# Patient Record
Sex: Female | Born: 1965
Health system: Southern US, Community
[De-identification: ages and names within clinical notes are randomized; demographics above are authoritative.]

## PROBLEM LIST (undated history)

## (undated) DIAGNOSIS — J45909 Unspecified asthma, uncomplicated: Secondary | ICD-10-CM

## (undated) DIAGNOSIS — E039 Hypothyroidism, unspecified: Secondary | ICD-10-CM

## (undated) DIAGNOSIS — G4733 Obstructive sleep apnea (adult) (pediatric): Principal | ICD-10-CM

## (undated) HISTORY — PX: REDUCTION MAMMAPLASTY: SUR839

## (undated) HISTORY — DX: Obstructive sleep apnea (adult) (pediatric): G47.33

## (undated) HISTORY — PX: ABDOMINAL HERNIA REPAIR: SHX539

## (undated) HISTORY — PX: ADENOIDECTOMY: SUR15

## (undated) HISTORY — PX: FINGER SURGERY: SHX640

## (undated) HISTORY — DX: Unspecified asthma, uncomplicated: J45.909

## (undated) HISTORY — DX: Hypothyroidism, unspecified: E03.9

## (undated) HISTORY — PX: TONSILLECTOMY: SUR1361

---

## 1994-02-24 HISTORY — PX: TUBAL LIGATION: SHX77

## 2004-02-25 HISTORY — PX: CERVICAL ABLATION: SHX5771

## 2005-11-07 ENCOUNTER — Ambulatory Visit (HOSPITAL_COMMUNITY): Admission: RE | Admit: 2005-11-07 | Discharge: 2005-11-07 | Payer: Self-pay | Admitting: Gynecology

## 2005-11-07 ENCOUNTER — Encounter (INDEPENDENT_AMBULATORY_CARE_PROVIDER_SITE_OTHER): Payer: Self-pay | Admitting: Specialist

## 2006-10-16 ENCOUNTER — Other Ambulatory Visit: Admission: RE | Admit: 2006-10-16 | Discharge: 2006-10-16 | Payer: Self-pay | Admitting: Gynecology

## 2010-07-12 NOTE — H&P (Signed)
NAME:  Andrea Terrell, Andrea Terrell             ACCOUNT NO.:  [id number]   MEDICAL RECORD NO.:  [id number]          PATIENT TYPE:  AMB   LOCATION:  SDC                           FACILITY:  [facility]   PHYSICIAN:  Timothy P. Fontaine, M.D.DATE OF BIRTH:  1965/07/31   DATE OF ADMISSION:  DATE OF DISCHARGE:                                HISTORY & PHYSICAL   She is being admitted to [facility] August 3, 7:30 for surgery.   CHIEF COMPLAINT:  Increasing menorrhagia and dysmenorrhea.   HISTORY OF PRESENT ILLNESS:  A 45 year old G31, P2, AB1 female status post  tubal sterilization who complains of increasing menorrhagia and  dysmenorrhea.  The patient was evaluated and treated for presumptive  endometriosis with 2 months' of Lupron which suppressed her menses and her  dysmenorrhea resolved.  Upon resuming her menses, pain returned which is now  becoming disabling.  During her menses, interfering with her life, noting  that she is bleeding 6-7 days each month.  She underwent outpatient  sonohistogram which showed a normal empty cavity and a thyroid TSH which was  normal.  Her hemoglobin was 14.5.  She is admitted at this time for laser  laparoscopy, NovaSure endometrial ablation hysteroscopy.   PAST MEDICAL HISTORY:  1.  Includes asthma.  2.  Anxiety.   PAST SURGICAL HISTORY:  1.  Includes umbilical hernia repair in 1971.  2.  Tonsil adenoidectomy.  3.  Tubal ligation.  4.  Breast reductive surgery, 1993.   CURRENT MEDICATIONS:  1.  Include Singulair 10 mg daily.  2.  Advair 550.  3.  Allegra 180 daily.  4.  Albuterol p.r.n.  5.  Effexor 75 mg daily.   CURRENT MEDICATION ALLERGIES:  INCLUDE PENICILLIN, SULFA.   REVIEW OF SYSTEMS:  Noncontributory.   SOCIAL HISTORY:  Noncontributory.   FAMILY HISTORY:  Noncontributory.   ADMISSION PHYSICAL EXAM:  Afebrile.  Vital signs stable.  HEENT: Normal.  LUNGS:  Clear.  CARDIAC:  Regular rate.  No rubs, murmurs or gallops.  ABDOMEN:  Exam  benign.  PELVIC:  External BUS, vagina normal.  Cervix normal.  Uterus normal size,  shape, contour, midline and mobile.  Adnexa without masses or tenderness.   ASSESSMENT:  A 45 year old G68, P2 female status post laparoscopic tubal  sterilization, increasing dysmenorrhea, menorrhagia, presumptively treated  for endometriosis per physician in [place] with Depo and Lupron x2 months,  resolution of her symptoms with now resumption of her symptoms.  The patient  is admitted at this time for laser laparoscopy, endometrial ablation and  hysteroscopy.  I reviewed with the patient the proposed surgeries, expected  intraoperative and postoperative courses, acute intraoperative and  postoperative risks and the long-term issues associated with her surgeries.  She understands there are no guarantees as far as her pain relief is  concerned.  Her pain may persist, worsen or change following the procedure  as well as her menorrhagia.  She understands that her bleeding may continue  to be heavy following the surgery.  She understands there are no guarantees  that she will be amenorrheic, that most patients following ablation  continue  to have menses although hopefully lighter.  She understands that she should  never achieve pregnancy following the procedure, although she has had a  tubal sterilization.  She should never pursue pregnancy and she understands  and accepts this.  I reviewed what is involved with a laparoscopy to include  insufflation, trocar placement, multiple port sites, use of sharp and blunt  dissection, electrocautery and laser.  The risk of infection both internal  requiring antibiotics or incisional requiring opening and draining of  incisions, closure by secondary intention was discussed, understood and  accepted.  The risks of hemorrhage necessitating transfusion and the risks  of transfusion including transfusion reaction, hepatitis, HIV, mad cow  disease and other unknown entities  was all discussed, understood and  accepted.  The risk of inadvertent injury to internal organs either  immediately recognized or delay recognized including bowel, bladder,  ureters, vessels and nerves necessitating major exploratory reparative  surgeries, future reparative surgeries including ostomy formation was all  discussed, understood and accepted.  The hysteroscopic endometrial ablation  portion of the procedure was also discussed.  She understands the technology  involved and the risks to include the risk of uterine perforation, damage to  internal organs as per above as well as the use of thermal energy and the  risk of thermal damage both to internal organs as well as to the genital  tract was all discussed, understood and accepted.  She understands that also  the ablation is instrument-dependent in that if there is a malfunction in  the machinery that we may not pursue the ablation.  Lastly, again it  stressed that her pain may continue, worsen or change following the  procedure and no guarantees as far as her menorrhagia relief were made.  She  clearly understands this.  The patient's questions were answered to her  satisfaction.  She is ready to proceed with surgery.      Timothy P. Fontaine, M.D.  Electronically Signed     TPF/MEDQ  D:  08/29/2005  T:  08/29/2005  Job:  [id number]

## 2010-07-12 NOTE — Op Note (Signed)
NAME:  Andrea Terrell, Andrea Terrell             ACCOUNT NO.:  [id number]   MEDICAL RECORD NO.:  [id number]          PATIENT TYPE:  AMB   LOCATION:  SDC                           [facility]ACILITY:  [facility]   PHYSICIAN:  Timothy P. Fontaine, M.D.DATE OF BIRTH:  1965-09-16   DATE OF PROCEDURE:  11/07/2005  DATE OF DISCHARGE:                                 OPERATIVE REPORT   PREOPERATIVE DIAGNOSES:  1. Increasing menorrhagia.  2. Increasing dysmenorrhea.   POSTOPERATIVE DIAGNOSES:  1. Increasing menorrhagia.  2. Increasing dysmenorrhea.  3. Right ovarian cyst, hemorrhagic.   PROCEDURE:  1. NovaSure endometrial ablation, hysteroscopy, and dilatation and      curettage.  2. Laparoscopic right ovarian cystectomy.   SURGEON:  Timothy P. Fontaine, M.D.   ASSISTANT:  Scrub technician.   ANESTHESIA:  General.   ESTIMATED BLOOD LOSS:  Minimal.   COMPLICATIONS:  None.   SPECIMEN:  1. Endometrial curetting.  2. Opening cell washing.  3. Right ovarian cyst wall.   PROCEDURE:  The patient was taken to the operating room, placed in the low  dorsal lithotomy position, received an abdominal perineal vaginal  preparation with Betadine solution.  The bladder emptied with indwelling  Foley catheterization placed in sterile technique.  EUA was performed.  The  patient was draped in the usual fashion.  The cervix visualized with a  surgical speculum, anterior lip grasped with single-tooth tenaculum, and the  endometrial and endocervical lengths were obtained.  The cervix was gently  dilated.  A sharp curettage performed.  Subsequently, the NovaSure  endometrial ablator was placed within the cavity, the instrument opened,  manipulation assured proper placement, the cavity width determined.  The  carbon dioxide test was then performed and passed and subsequently, the  endometrial ablation performed without difficulty.  The instrument was then  removed.  Hysteroscopy performed and showed good distension, a  uniform  treatment of the cavity, and no evidence of perforation.  A Hulka tacker was  then placed in the cervix, the speculum removed.   Attention was turned to the laparoscopic portion of the procedure.  After re-  gloving, a transverse incision was made infraumbilically through a prior  scar and the 10-mm OptiVu type trocar was placed under direct visualization  without difficulty.  The abdomen was then insufflated. Right and left  suprapubic 5-mm ports were then placed under direct visualization after  transillumination for the vessels without difficulty.  Examination of the  pelvic organs and  upper abdominal exam carried out with findings noted in  the findings portion..  Due to the size of the right ovarian cyst, it was  felt prudent to remove this. Opening cell washing was taken. Subsequently,  the ovary was grasped and stabilized and sharply incised.  Hemorrhagic fluid  extruded.  Upon irrigation of the cavity, it was apparent that this was a  physiologic corpus luteal type appearing cyst.  The cyst wall was stripped  from the surrounding stroma. Bipolar cautery was then delivered to the  oozing stromal site in a brushing manner to achieve ultimate hemostasis.  The ovary was copiously irrigated and adequate  hemostasis was visualized.  No Intercede was placed due to the way the ovary closed naturally and it was  felt not needed as the ovarian capsule nicely reapproximated itself  spontaneously.  The suprapubic ports were then removed. The gas was slowly  allowed to escape. Adequate hemostasis was visualized at the ovary as well  as the 5 mm port sites.  The infraumbilical port was then backed out under  direct visualization showing adequate hemostasis.  No evidence of hernia  formation.  All skin incision sites were injected using 0.25% Marcaine.  A 0  Vicryl interrupted subcutaneous fascial stitch was placed infraumbilically.  All skin sites were closed with Dermabond skin  adhesive.  The Hulka  tenaculum was removed.  The Foley was removed.  The patient was placed in  the supine position, awakened without difficulty, and taken to the recovery  room in good condition having tolerated the procedure well.   FINDINGS:  EUA revealed external BUS and vagina normal.  Cervix grossly  normal.  Bimanual showed uterus grossly normal size.  Adnexa without gross  masses limited by abdominal girth. Hysteroscopic placed ablated hysteroscopy  showed an empty cavity, good distension, no evidence of perforation, uniform  treatment of the endometrium to the level of the lower uterine segment.  Laparoscopic showed anterior cul-de-sac normal, posterior cul-de-sac normal,  uterus normal size, shape, contour. Right and left fallopian tubes with  short proximal segments bilaterally.  Normal appearing distal segments  consistent with her past history of tubal sterilization.  Left ovary grossly  normal, free and mobile.  Right ovary approximately 4 cm, tense cystic mass,  capsule was smooth, no evidence of excrescences or other pathology.  Upper  abdominal exam was filled with omentum, liver, gallbladder, small bowel,  appendix not visualized.  No evidence of adhesive disease or overt  pathology.      Timothy P. Fontaine, M.D.  Electronically Signed     TPF/MEDQ  D:  11/07/2005  T:  11/07/2005  Job:  [id number]

## 2010-07-12 NOTE — H&P (Signed)
NAME:  Andrea Terrell, Andrea Terrell             ACCOUNT NO.:  [id number]   MEDICAL RECORD NO.:  [id number]          PATIENT TYPE:  AMB   LOCATION:  SDC                           FACILITY:  [facility]   PHYSICIAN:  Timothy P. Fontaine, M.D.DATE OF BIRTH:  Jun 26, 1965   DATE OF ADMISSION:  11/07/2005  DATE OF DISCHARGE:                                HISTORY & PHYSICAL   The patient was previously scheduled September 26, 2005 for surgery and  cancelled due to work conflict.  She is rescheduled for surgery now.  On  reevaluation October 31, 2005, her history and physical have remained  unchanged.  She was re-counseled for the surgery per my prior dictation, and  she has no questions.  See her prior dictation for her history and physical.      Timothy P. Fontaine, M.D.  Electronically Signed     TPF/MEDQ  D:  10/31/2005  T:  10/31/2005  Job:  [id number]

## 2014-10-26 DIAGNOSIS — J4550 Severe persistent asthma, uncomplicated: Secondary | ICD-10-CM | POA: Insufficient documentation

## 2014-10-26 DIAGNOSIS — Z8709 Personal history of other diseases of the respiratory system: Secondary | ICD-10-CM | POA: Insufficient documentation

## 2014-11-03 ENCOUNTER — Other Ambulatory Visit: Payer: Self-pay | Admitting: *Deleted

## 2014-11-03 MED ORDER — OMALIZUMAB 150 MG ~~LOC~~ SOLR
300.0000 mg | SUBCUTANEOUS | Status: DC
  Administered 2014-12-13 – 2022-08-25 (×67): 300 mg via SUBCUTANEOUS

## 2014-12-13 ENCOUNTER — Ambulatory Visit (INDEPENDENT_AMBULATORY_CARE_PROVIDER_SITE_OTHER): Payer: BLUE CROSS/BLUE SHIELD

## 2014-12-13 DIAGNOSIS — J4540 Moderate persistent asthma, uncomplicated: Secondary | ICD-10-CM

## 2014-12-13 DIAGNOSIS — J4550 Severe persistent asthma, uncomplicated: Secondary | ICD-10-CM

## 2015-01-17 ENCOUNTER — Ambulatory Visit (INDEPENDENT_AMBULATORY_CARE_PROVIDER_SITE_OTHER): Payer: BLUE CROSS/BLUE SHIELD | Admitting: *Deleted

## 2015-01-17 DIAGNOSIS — J4550 Severe persistent asthma, uncomplicated: Secondary | ICD-10-CM

## 2015-02-21 ENCOUNTER — Ambulatory Visit (INDEPENDENT_AMBULATORY_CARE_PROVIDER_SITE_OTHER): Payer: BLUE CROSS/BLUE SHIELD

## 2015-02-21 DIAGNOSIS — J4550 Severe persistent asthma, uncomplicated: Secondary | ICD-10-CM | POA: Diagnosis not present

## 2015-04-11 ENCOUNTER — Ambulatory Visit (INDEPENDENT_AMBULATORY_CARE_PROVIDER_SITE_OTHER): Payer: BLUE CROSS/BLUE SHIELD | Admitting: *Deleted

## 2015-04-11 DIAGNOSIS — J4540 Moderate persistent asthma, uncomplicated: Secondary | ICD-10-CM

## 2015-05-01 DIAGNOSIS — J4540 Moderate persistent asthma, uncomplicated: Secondary | ICD-10-CM | POA: Diagnosis not present

## 2015-05-16 ENCOUNTER — Ambulatory Visit (INDEPENDENT_AMBULATORY_CARE_PROVIDER_SITE_OTHER): Payer: BLUE CROSS/BLUE SHIELD | Admitting: *Deleted

## 2015-05-16 DIAGNOSIS — J4540 Moderate persistent asthma, uncomplicated: Secondary | ICD-10-CM | POA: Diagnosis not present

## 2015-05-31 DIAGNOSIS — M9902 Segmental and somatic dysfunction of thoracic region: Secondary | ICD-10-CM | POA: Diagnosis not present

## 2015-05-31 DIAGNOSIS — M9901 Segmental and somatic dysfunction of cervical region: Secondary | ICD-10-CM | POA: Diagnosis not present

## 2015-05-31 DIAGNOSIS — M545 Low back pain: Secondary | ICD-10-CM | POA: Diagnosis not present

## 2015-05-31 DIAGNOSIS — M542 Cervicalgia: Secondary | ICD-10-CM | POA: Diagnosis not present

## 2015-06-15 DIAGNOSIS — M9902 Segmental and somatic dysfunction of thoracic region: Secondary | ICD-10-CM | POA: Diagnosis not present

## 2015-06-15 DIAGNOSIS — M542 Cervicalgia: Secondary | ICD-10-CM | POA: Diagnosis not present

## 2015-06-15 DIAGNOSIS — M9901 Segmental and somatic dysfunction of cervical region: Secondary | ICD-10-CM | POA: Diagnosis not present

## 2015-06-15 DIAGNOSIS — M545 Low back pain: Secondary | ICD-10-CM | POA: Diagnosis not present

## 2015-06-27 ENCOUNTER — Ambulatory Visit (INDEPENDENT_AMBULATORY_CARE_PROVIDER_SITE_OTHER): Payer: BLUE CROSS/BLUE SHIELD

## 2015-06-27 DIAGNOSIS — J4540 Moderate persistent asthma, uncomplicated: Secondary | ICD-10-CM

## 2015-07-20 DIAGNOSIS — M9902 Segmental and somatic dysfunction of thoracic region: Secondary | ICD-10-CM | POA: Diagnosis not present

## 2015-07-20 DIAGNOSIS — M9901 Segmental and somatic dysfunction of cervical region: Secondary | ICD-10-CM | POA: Diagnosis not present

## 2015-07-20 DIAGNOSIS — M545 Low back pain: Secondary | ICD-10-CM | POA: Diagnosis not present

## 2015-07-20 DIAGNOSIS — M542 Cervicalgia: Secondary | ICD-10-CM | POA: Diagnosis not present

## 2015-08-09 ENCOUNTER — Ambulatory Visit (INDEPENDENT_AMBULATORY_CARE_PROVIDER_SITE_OTHER): Payer: BLUE CROSS/BLUE SHIELD | Admitting: *Deleted

## 2015-08-09 DIAGNOSIS — J4540 Moderate persistent asthma, uncomplicated: Secondary | ICD-10-CM

## 2015-08-10 DIAGNOSIS — E559 Vitamin D deficiency, unspecified: Secondary | ICD-10-CM | POA: Diagnosis not present

## 2015-08-10 DIAGNOSIS — E039 Hypothyroidism, unspecified: Secondary | ICD-10-CM | POA: Diagnosis not present

## 2015-08-10 DIAGNOSIS — Z0000 Encounter for general adult medical examination without abnormal findings: Secondary | ICD-10-CM | POA: Diagnosis not present

## 2015-08-10 DIAGNOSIS — E785 Hyperlipidemia, unspecified: Secondary | ICD-10-CM | POA: Diagnosis not present

## 2015-08-10 DIAGNOSIS — Z1389 Encounter for screening for other disorder: Secondary | ICD-10-CM | POA: Diagnosis not present

## 2015-08-11 ENCOUNTER — Other Ambulatory Visit: Payer: Self-pay | Admitting: Internal Medicine

## 2015-08-11 DIAGNOSIS — Z1231 Encounter for screening mammogram for malignant neoplasm of breast: Secondary | ICD-10-CM

## 2015-08-30 DIAGNOSIS — J4540 Moderate persistent asthma, uncomplicated: Secondary | ICD-10-CM | POA: Diagnosis not present

## 2015-09-13 ENCOUNTER — Ambulatory Visit (INDEPENDENT_AMBULATORY_CARE_PROVIDER_SITE_OTHER): Payer: BLUE CROSS/BLUE SHIELD | Admitting: *Deleted

## 2015-09-13 DIAGNOSIS — J4540 Moderate persistent asthma, uncomplicated: Secondary | ICD-10-CM

## 2015-09-17 ENCOUNTER — Other Ambulatory Visit: Payer: Self-pay | Admitting: Allergy and Immunology

## 2015-10-18 ENCOUNTER — Ambulatory Visit (INDEPENDENT_AMBULATORY_CARE_PROVIDER_SITE_OTHER): Payer: BLUE CROSS/BLUE SHIELD | Admitting: *Deleted

## 2015-10-18 DIAGNOSIS — J4540 Moderate persistent asthma, uncomplicated: Secondary | ICD-10-CM

## 2015-10-30 ENCOUNTER — Institutional Professional Consult (permissible substitution): Payer: Self-pay | Admitting: Pulmonary Disease

## 2015-11-02 ENCOUNTER — Other Ambulatory Visit: Payer: Self-pay | Admitting: Allergy and Immunology

## 2015-11-07 DIAGNOSIS — J4540 Moderate persistent asthma, uncomplicated: Secondary | ICD-10-CM | POA: Diagnosis not present

## 2015-11-16 ENCOUNTER — Institutional Professional Consult (permissible substitution): Payer: Self-pay | Admitting: Pulmonary Disease

## 2015-11-16 DIAGNOSIS — M545 Low back pain: Secondary | ICD-10-CM | POA: Diagnosis not present

## 2015-11-16 DIAGNOSIS — M9902 Segmental and somatic dysfunction of thoracic region: Secondary | ICD-10-CM | POA: Diagnosis not present

## 2015-11-16 DIAGNOSIS — M9901 Segmental and somatic dysfunction of cervical region: Secondary | ICD-10-CM | POA: Diagnosis not present

## 2015-11-16 DIAGNOSIS — M542 Cervicalgia: Secondary | ICD-10-CM | POA: Diagnosis not present

## 2015-11-22 ENCOUNTER — Encounter: Payer: Self-pay | Admitting: Allergy

## 2015-11-22 ENCOUNTER — Ambulatory Visit (INDEPENDENT_AMBULATORY_CARE_PROVIDER_SITE_OTHER): Payer: BLUE CROSS/BLUE SHIELD | Admitting: Allergy

## 2015-11-22 VITALS — BP 122/86 | HR 72 | Resp 16 | Ht 64.25 in | Wt 220.8 lb

## 2015-11-22 DIAGNOSIS — J4551 Severe persistent asthma with (acute) exacerbation: Secondary | ICD-10-CM | POA: Diagnosis not present

## 2015-11-22 DIAGNOSIS — J4550 Severe persistent asthma, uncomplicated: Secondary | ICD-10-CM

## 2015-11-22 DIAGNOSIS — L253 Unspecified contact dermatitis due to other chemical products: Secondary | ICD-10-CM | POA: Diagnosis not present

## 2015-11-22 DIAGNOSIS — J309 Allergic rhinitis, unspecified: Secondary | ICD-10-CM

## 2015-11-22 MED ORDER — PREDNISONE 20 MG PO TABS: ORAL_TABLET | ORAL | 0 refills | Status: DC

## 2015-11-22 MED ORDER — MONTELUKAST SODIUM 10 MG PO TABS: 10.0000 mg | ORAL_TABLET | Freq: Every day | ORAL | 5 refills | Status: DC

## 2015-11-22 MED ORDER — DESONIDE 0.05 % EX CREA: TOPICAL_CREAM | Freq: Two times a day (BID) | CUTANEOUS | 0 refills | Status: DC

## 2015-11-22 MED ORDER — ALBUTEROL SULFATE HFA 108 (90 BASE) MCG/ACT IN AERS: 2.0000 | INHALATION_SPRAY | RESPIRATORY_TRACT | 0 refills | Status: DC | PRN

## 2015-11-22 NOTE — Patient Instructions (Addendum)
Asthma exacerbation     - take prednisone 40mg  x 5 days     - while flared continue Advair 500  2puffs twice a day     - continue albuterol while flared taked 2 puffs every 4 hours x 2 days, then ever 6 hours x 2 days then every 8 hours x 2 day that resume as needed use     - continue singulair 10mg  daily (will refill today)     - continue xolair monthly injections   Allergic rhinitis      - continue Allergra as needed      - Singulair as above   Contact dermatitis         - steroids as above       - may use topical desonide 0.05% on affected areas twice a day until resolved       - allegra for itch control   Follow-up 4 months

## 2015-11-22 NOTE — Progress Notes (Signed)
Follow-up Note  RE: Andrea Terrell MRN: [id number] DOB: April 09, 1965 Date of Office Visit: 11/22/2015   History of present illness: Andrea Terrell is a 50 y.o. female presenting today for sick visit and medication refills. She was last seen in our office on December 2015 by Dr. Lucie LeatherKozlow. She has been followed for her asthma and allergies. With her asthma she is on monthly Xolair injections. She does well with the injections themselves. She says that she was doing well for a long time which is why she has not returned for a follow-up visit. However about a week ago she started having worsening shortness of breath and cough. She has been using her ProAir. 3-4 times a day over the past week to relieve her symptoms. She denies having any nighttime awakenings. She did run out of her Singulair 3 weeks ago and needed to return for follow-up visit for to be refilled. She also has been having more postnasal drainage and her throat has been feeling itchy. She normally uses her Allegra as needed but has been using it over the past week.  She also started using her Advair 2 puffs twice a day with her asthma flare. Otherwise she states that she has not needed to use it as she had been doing well.  She also has broken out in a rash about a week ago as well.  She initially thought it was a mosquito bites however the rash continued to spread. The rash is in her armpit area, low back, back of her thighs. The rash is itchy and has red bumps. She recall using an organic laundry detergent that her daughter gave her around the time that the rash started. She denies any new foods or medications or no stings.     Review of systems: Review of Systems  Constitutional: Negative for chills and fever.  HENT: Positive for congestion. Negative for sore throat.   Eyes: Negative for redness.  Respiratory: Positive for cough and shortness of breath. Negative for wheezing.   Cardiovascular: Negative for chest pain.    Gastrointestinal: Negative for nausea and vomiting.  Skin: Positive for itching and rash.  Neurological: Negative for headaches.    All other systems negative unless noted above in HPI  Past medical/social/surgical/family history have been reviewed and are unchanged unless specifically indicated below.  No changes  Medication List:   Medication List       Accurate as of 11/22/15  4:58 PM. Always use your most recent med list.          albuterol 108 (90 Base) MCG/ACT inhaler Commonly known as:  PROAIR HFA Inhale 2 puffs into the lungs every 4 (four) hours as needed for wheezing or shortness of breath.   ALLEGRA ALLERGY 180 MG tablet Generic drug:  fexofenadine Take 180 mg by mouth daily as needed for allergies or rhinitis.   desonide 0.05 % cream Commonly known as:  DESOWEN Apply topically 2 (two) times daily.   EFFEXOR PO Take by mouth.   EPIPEN 2-PAK 0.3 mg/0.3 mL Soaj injection Generic drug:  EPINEPHrine Inject 0.3 mg into the muscle once.   Fluticasone-Salmeterol 500-50 MCG/DOSE Aepb Commonly known as:  ADVAIR Inhale 1 puff into the lungs 2 (two) times daily.   montelukast 10 MG tablet Commonly known as:  SINGULAIR Take 1 tablet (10 mg total) by mouth at bedtime.   omalizumab 150 MG injection Commonly known as:  XOLAIR Inject 300 mg into the skin every 28 (twenty-eight) days.  predniSONE 20 MG tablet Commonly known as:  DELTASONE Take 2 tablets once daily for 5 days   SYNTHROID PO Take by mouth.       Known medication allergies: Allergies  Allergen Reactions  . Codeine Other (See Comments)    NERVOUSNESS  . Penicillins Rash  . Sulfa Antibiotics Rash     Physical examination: Blood pressure 122/86, pulse 72, resp. rate 16, height 5' 4.25" (1.632 m), weight 220 lb 12.8 oz (100.2 kg).  General: Alert, interactive, in no acute distress. HEENT: TMs pearly gray, turbinates minimally edematous without discharge, post-pharynx non  erythematous. Neck: Supple without lymphadenopathy. Lungs: Clear to auscultation without wheezing, rhonchi or rales. {no increased work of breathing. CV: Normal S1, S2 without murmurs. Abdomen: Nondistended, nontender. Skin: Erythematous papules in clusters located in the axilla, lower back, posterior thigh. Extremities:  No clubbing, cyanosis or edema. Neuro:   Grossly intact.  Diagnositics/Labs:  Spirometry: FEV1: 1.7L  60%, FVC: 2.27L  64%, lung functions are reduced from prior study   Assessment and plan:   Asthma, Severe persistent exacerbation     - take prednisone 40mg  x 5 days     - while flared continue Advair 500  2puffs twice a day     - continue albuterol while flared taked 2 puffs every 4 hours x 2 days, then ever 6 hours x 2 days then every 8 hours x 2 day that resume as needed use     - continue singulair 10mg  daily (will refill today)     - continue xolair monthly injections  Asthma control goals:   Full participation in all desired activities (may need albuterol before activity)  Albuterol use two time or less a week on average (not counting use with activity)  Cough interfering with sleep two time or less a month  Oral steroids no more than once a year  No hospitalizations   Allergic rhinitis      - continue Allergra as needed      - Singulair as above   Contact dermatitis         - steroids as above       - may use topical desonide 0.05% on affected areas twice a day until resolved       - allegra for itch control   Follow-up 4 months  I appreciate the opportunity to take part in Andrea Terrell's care. Please do not hesitate to contact me with questions.  Sincerely,   Margo Aye, MD Allergy/Immunology [facility]

## 2015-11-30 DIAGNOSIS — Z23 Encounter for immunization: Secondary | ICD-10-CM | POA: Diagnosis not present

## 2015-12-05 DIAGNOSIS — J4540 Moderate persistent asthma, uncomplicated: Secondary | ICD-10-CM | POA: Diagnosis not present

## 2015-12-20 ENCOUNTER — Ambulatory Visit (INDEPENDENT_AMBULATORY_CARE_PROVIDER_SITE_OTHER): Payer: BLUE CROSS/BLUE SHIELD | Admitting: *Deleted

## 2015-12-20 DIAGNOSIS — J4540 Moderate persistent asthma, uncomplicated: Secondary | ICD-10-CM | POA: Diagnosis not present

## 2015-12-29 DIAGNOSIS — H524 Presbyopia: Secondary | ICD-10-CM | POA: Diagnosis not present

## 2015-12-29 DIAGNOSIS — H5200 Hypermetropia, unspecified eye: Secondary | ICD-10-CM | POA: Diagnosis not present

## 2016-01-04 ENCOUNTER — Encounter: Payer: Self-pay | Admitting: Pulmonary Disease

## 2016-01-04 ENCOUNTER — Ambulatory Visit (INDEPENDENT_AMBULATORY_CARE_PROVIDER_SITE_OTHER): Payer: BLUE CROSS/BLUE SHIELD | Admitting: Pulmonary Disease

## 2016-01-04 VITALS — BP 144/92 | HR 86 | Ht 65.0 in | Wt 222.8 lb

## 2016-01-04 DIAGNOSIS — R0683 Snoring: Secondary | ICD-10-CM | POA: Diagnosis not present

## 2016-01-04 NOTE — Progress Notes (Signed)
   Subjective:    Patient ID: Andrea Terrell, female    DOB: 12-06-1965, 50 y.o.   MRN: [id number]  HPI    Review of Systems  Constitutional: Negative for fever and unexpected weight change.  HENT: Negative for congestion, dental problem, ear pain, nosebleeds, postnasal drip, rhinorrhea, sinus pressure, sneezing, sore throat and trouble swallowing.   Eyes: Negative for redness and itching.  Respiratory: Positive for shortness of breath. Negative for cough, chest tightness and wheezing.   Cardiovascular: Negative for palpitations and leg swelling.  Gastrointestinal: Negative for nausea and vomiting.  Genitourinary: Negative for dysuria.  Musculoskeletal: Negative for joint swelling.  Skin: Negative for rash.  Neurological: Negative for headaches.  Hematological: Does not bruise/bleed easily.  Psychiatric/Behavioral: Negative for dysphoric mood. The patient is nervous/anxious.        Objective:   Physical Exam        Assessment & Plan:

## 2016-01-04 NOTE — Progress Notes (Signed)
Past Surgical History She  has a past surgical history that includes Adenoidectomy; Tonsillectomy; Abdominal hernia repair; Finger surgery; Tubal ligation (1996); and Cervical ablation (2006).  Allergies  Allergen Reactions  . Codeine Other (See Comments)    NERVOUSNESS  . Penicillins Rash  . Sulfa Antibiotics Rash    Family History Her family history includes COPD in her maternal uncle and mother; Diabetes in her maternal aunt; Heart attack in her maternal grandfather and paternal grandfather; Heart disease in her brother and mother; High blood pressure in her father; Stroke in her paternal grandmother.  Social History She  reports that she quit smoking about 28 years ago. Her smoking use included Cigarettes. She has a 2.50 pack-year smoking history. She has never used smokeless tobacco. She reports that she does not drink alcohol or use drugs.  Review of systems Anxiety, dyspnea.  Otherwise negative.  Current Outpatient Prescriptions on File Prior to Visit  Medication Sig  . albuterol (PROAIR HFA) 108 (90 Base) MCG/ACT inhaler Inhale 2 puffs into the lungs every 4 (four) hours as needed for wheezing or shortness of breath.  . EPINEPHrine (EPIPEN 2-PAK) 0.3 mg/0.3 mL IJ SOAJ injection Inject 0.3 mg into the muscle once.  . fexofenadine (ALLEGRA ALLERGY) 180 MG tablet Take 180 mg by mouth daily as needed for allergies or rhinitis.  . Levothyroxine Sodium (SYNTHROID PO) Take by mouth.  . montelukast (SINGULAIR) 10 MG tablet Take 1 tablet (10 mg total) by mouth at bedtime.  Marland Kitchen. omalizumab (XOLAIR) 150 MG injection Inject 300 mg into the skin every 28 (twenty-eight) days.  . Venlafaxine HCl (EFFEXOR PO) Take by mouth.  . Fluticasone-Salmeterol (ADVAIR) 500-50 MCG/DOSE AEPB Inhale 1 puff into the lungs 2 (two) times daily.   Current Facility-Administered Medications on File Prior to Visit  Medication  . omalizumab Geoffry Paradise(XOLAIR) injection 300 mg    Chief Complaint  Patient presents with  .  SLEEP CONSULT    Referred by Dr Tomasa BlaseSchultz. Epworth Score: 3    Pulmonary tests Spirometry 11/22/15 >> FEV1 1.70 (60%), FEV1% 80   Past medical history She  has a past medical history of Asthma and Hypothyroid.  Vital signs BP (!) 132/94 (BP Location: Left Arm, Cuff Size: Normal)   Pulse 86   Ht 5\' 5"  (1.651 m)   Wt 222 lb 12.8 oz (101.1 kg)   SpO2 98%   BMI 37.08 kg/m   History of Present Illness Andrea Terrell is a 50 y.o. female for evaluation of sleep problems.  She works in a [employer] office.  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  She was told her snoring was terrible, and that she stops breathing while asleep.  She tends to sleep on her back.  She does wake up hearing herself snore.  She will wake up during dreams, and her dream content is sometimes related to feeling like she can't breath.  She goes to sleep at 10 pm.  She falls asleep after an hour.  It is hard for her to wind down once she is in bed.  She wakes up 3 to 4 times to use the bathroom.  She gets out of bed at 630 am.  She feels tired in the morning, and never feels like she can catch up on her sleep.  She denies morning headache.  She does not use anything to help her fall sleep or stay awake.  She start to yawn at work after  lunch.  It is hard for her to keep her eyes open in the evening when watching TV.  She denies sleep walking, sleep talking, bruxism, or nightmares.  There is no history of restless legs.  She denies sleep hallucinations, sleep paralysis, or cataplexy.  The Epworth score is 3 out of 24.   Physical Exam:  General - No distress ENT - No sinus tenderness, no oral exudate, no LAN, no thyromegaly, TM clear, pupils equal/reactive, MP 2, high arched palate Cardiac - s1s2 regular, no murmur, pulses symmetric Chest - No wheeze/rales/dullness, good air entry, normal respiratory excursion Back - No focal tenderness Abd - Soft, non-tender, no organomegaly,  + bowel sounds Ext - No edema Neuro - Normal strength, cranial nerves intact Skin - No rashes Psych - Normal mood, and behavior  Discussion: She has snoring, sleep disruption, witnessed apnea, and daytime sleepiness.  Her BMI is > 35.  I am concerned she could have sleep apnea.  We discussed how sleep apnea can affect various health problems, including risks for hypertension, cardiovascular disease, and diabetes.  We also discussed how sleep disruption can increase risks for accidents, such as while driving.  Weight loss as a means of improving sleep apnea was also reviewed.  Additional treatment options discussed were CPAP therapy, oral appliance, and surgical intervention.  Assessment/plan:  Snoring with concern for sleep apnea.  - will arrange for home sleep study  Obesity. - discussed importance of weight loss   Patient Instructions  Will arrange for home sleep study Will call to arrange for follow up after sleep study reviewed     Coralyn HellingVineet Shamone Winzer, M.D. Pager [phone] 01/04/2016, 2:03 PM

## 2016-01-04 NOTE — Patient Instructions (Signed)
Will arrange for home sleep study Will call to arrange for follow up after sleep study reviewed  

## 2016-01-10 DIAGNOSIS — J4540 Moderate persistent asthma, uncomplicated: Secondary | ICD-10-CM | POA: Diagnosis not present

## 2016-01-16 ENCOUNTER — Ambulatory Visit (INDEPENDENT_AMBULATORY_CARE_PROVIDER_SITE_OTHER): Payer: BLUE CROSS/BLUE SHIELD | Admitting: *Deleted

## 2016-01-16 DIAGNOSIS — J4540 Moderate persistent asthma, uncomplicated: Secondary | ICD-10-CM | POA: Diagnosis not present

## 2016-02-07 DIAGNOSIS — G4733 Obstructive sleep apnea (adult) (pediatric): Secondary | ICD-10-CM | POA: Diagnosis not present

## 2016-02-08 ENCOUNTER — Telehealth: Payer: Self-pay | Admitting: Pulmonary Disease

## 2016-02-08 ENCOUNTER — Encounter: Payer: Self-pay | Admitting: Pulmonary Disease

## 2016-02-08 DIAGNOSIS — G4733 Obstructive sleep apnea (adult) (pediatric): Secondary | ICD-10-CM

## 2016-02-08 DIAGNOSIS — J4540 Moderate persistent asthma, uncomplicated: Secondary | ICD-10-CM | POA: Diagnosis not present

## 2016-02-08 HISTORY — DX: Obstructive sleep apnea (adult) (pediatric): G47.33

## 2016-02-08 NOTE — Telephone Encounter (Signed)
HST 02/07/16 >> AHI 7.6, SaO2 low 76%   Will have my nurse inform pt that sleep study shows mild sleep apnea.  Options are 1) CPAP now, 2) ROV first.  If She is agreeable to CPAP, then please send order for auto CPAP range 5 to 15 cm H2O with heated humidity and mask of choice.  Have download sent 1 month after starting CPAP and set up ROV 2 months after starting CPAP.  ROV can be with me or NP.

## 2016-02-11 DIAGNOSIS — G4733 Obstructive sleep apnea (adult) (pediatric): Secondary | ICD-10-CM | POA: Diagnosis not present

## 2016-02-12 ENCOUNTER — Other Ambulatory Visit: Payer: Self-pay | Admitting: *Deleted

## 2016-02-12 DIAGNOSIS — R0683 Snoring: Secondary | ICD-10-CM

## 2016-02-15 NOTE — Telephone Encounter (Signed)
Spoke with the pt and notified of recs per VS  She verbalized understanding  She wants to get started on CPAP  [facility] She is aware to set up 2 mo f/u after receives machine

## 2016-02-20 ENCOUNTER — Ambulatory Visit (INDEPENDENT_AMBULATORY_CARE_PROVIDER_SITE_OTHER): Payer: BLUE CROSS/BLUE SHIELD | Admitting: *Deleted

## 2016-02-20 DIAGNOSIS — J4540 Moderate persistent asthma, uncomplicated: Secondary | ICD-10-CM

## 2016-03-11 DIAGNOSIS — J4540 Moderate persistent asthma, uncomplicated: Secondary | ICD-10-CM | POA: Diagnosis not present

## 2016-03-26 ENCOUNTER — Ambulatory Visit (INDEPENDENT_AMBULATORY_CARE_PROVIDER_SITE_OTHER): Payer: BLUE CROSS/BLUE SHIELD | Admitting: *Deleted

## 2016-03-26 DIAGNOSIS — J4540 Moderate persistent asthma, uncomplicated: Secondary | ICD-10-CM | POA: Diagnosis not present

## 2016-04-15 DIAGNOSIS — J4540 Moderate persistent asthma, uncomplicated: Secondary | ICD-10-CM | POA: Diagnosis not present

## 2016-04-23 ENCOUNTER — Ambulatory Visit (INDEPENDENT_AMBULATORY_CARE_PROVIDER_SITE_OTHER): Payer: BLUE CROSS/BLUE SHIELD | Admitting: *Deleted

## 2016-04-23 DIAGNOSIS — J4540 Moderate persistent asthma, uncomplicated: Secondary | ICD-10-CM | POA: Diagnosis not present

## 2016-04-24 DIAGNOSIS — G4733 Obstructive sleep apnea (adult) (pediatric): Secondary | ICD-10-CM | POA: Diagnosis not present

## 2016-05-15 DIAGNOSIS — J4540 Moderate persistent asthma, uncomplicated: Secondary | ICD-10-CM | POA: Diagnosis not present

## 2016-05-27 ENCOUNTER — Other Ambulatory Visit: Payer: Self-pay | Admitting: Allergy

## 2016-05-27 DIAGNOSIS — M542 Cervicalgia: Secondary | ICD-10-CM | POA: Diagnosis not present

## 2016-05-27 DIAGNOSIS — M62830 Muscle spasm of back: Secondary | ICD-10-CM | POA: Diagnosis not present

## 2016-05-27 DIAGNOSIS — J309 Allergic rhinitis, unspecified: Secondary | ICD-10-CM

## 2016-05-27 DIAGNOSIS — M9901 Segmental and somatic dysfunction of cervical region: Secondary | ICD-10-CM | POA: Diagnosis not present

## 2016-05-27 DIAGNOSIS — M9902 Segmental and somatic dysfunction of thoracic region: Secondary | ICD-10-CM | POA: Diagnosis not present

## 2016-05-27 DIAGNOSIS — J4551 Severe persistent asthma with (acute) exacerbation: Secondary | ICD-10-CM

## 2016-05-28 ENCOUNTER — Ambulatory Visit (INDEPENDENT_AMBULATORY_CARE_PROVIDER_SITE_OTHER): Payer: BLUE CROSS/BLUE SHIELD | Admitting: *Deleted

## 2016-05-28 DIAGNOSIS — J4540 Moderate persistent asthma, uncomplicated: Secondary | ICD-10-CM

## 2016-06-06 ENCOUNTER — Ambulatory Visit (INDEPENDENT_AMBULATORY_CARE_PROVIDER_SITE_OTHER): Payer: BLUE CROSS/BLUE SHIELD | Admitting: Pulmonary Disease

## 2016-06-06 ENCOUNTER — Encounter: Payer: Self-pay | Admitting: Pulmonary Disease

## 2016-06-06 VITALS — BP 112/80 | HR 83 | Ht 65.0 in | Wt 222.6 lb

## 2016-06-06 DIAGNOSIS — M62830 Muscle spasm of back: Secondary | ICD-10-CM | POA: Diagnosis not present

## 2016-06-06 DIAGNOSIS — Z6835 Body mass index (BMI) 35.0-35.9, adult: Secondary | ICD-10-CM | POA: Diagnosis not present

## 2016-06-06 DIAGNOSIS — M9902 Segmental and somatic dysfunction of thoracic region: Secondary | ICD-10-CM | POA: Diagnosis not present

## 2016-06-06 DIAGNOSIS — M9901 Segmental and somatic dysfunction of cervical region: Secondary | ICD-10-CM | POA: Diagnosis not present

## 2016-06-06 DIAGNOSIS — G4733 Obstructive sleep apnea (adult) (pediatric): Secondary | ICD-10-CM

## 2016-06-06 DIAGNOSIS — M542 Cervicalgia: Secondary | ICD-10-CM | POA: Diagnosis not present

## 2016-06-06 DIAGNOSIS — Z9989 Dependence on other enabling machines and devices: Secondary | ICD-10-CM | POA: Diagnosis not present

## 2016-06-06 NOTE — Patient Instructions (Addendum)
Follow up in 1 year.

## 2016-06-06 NOTE — Progress Notes (Signed)
Current Outpatient Prescriptions on File Prior to Visit  Medication Sig  . albuterol (PROAIR HFA) 108 (90 Base) MCG/ACT inhaler Inhale 2 puffs into the lungs every 4 (four) hours as needed for wheezing or shortness of breath.  . EPINEPHrine (EPIPEN 2-PAK) 0.3 mg/0.3 mL IJ SOAJ injection Inject 0.3 mg into the muscle once.  . fexofenadine (ALLEGRA ALLERGY) 180 MG tablet Take 180 mg by mouth daily as needed for allergies or rhinitis.  . Fluticasone-Salmeterol (ADVAIR) 500-50 MCG/DOSE AEPB Inhale 1 puff into the lungs 2 (two) times daily.  . Levothyroxine Sodium (SYNTHROID PO) Take by mouth.  . montelukast (SINGULAIR) 10 MG tablet TAKE 1 TABLET BY MOUTH DAILY AT BEDTIME.  Marland Kitchen omalizumab (XOLAIR) 150 MG injection Inject 300 mg into the skin every 28 (twenty-eight) days.  . Venlafaxine HCl (EFFEXOR PO) Take by mouth.   Current Facility-Administered Medications on File Prior to Visit  Medication  . omalizumab Geoffry Paradise) injection 300 mg     Chief Complaint  Patient presents with  . Follow-up    Wears CPAP nightly. Denies problems with mask/pressure. DME:[facility]     Sleep tests HST 02/07/16 >> AHI 7.6, SaO2 low 76% Auto CPAP 05/06/16 to 06/04/16 >> used on 30 of 30 nights with average 8 hrs 40 min.  Average AHI 1.8 with median CPAP 10 and 95 th percentile CPAP 14 cm H2O  Pulmonary tests Spirometry 11/22/15 >> FEV1 1.70 (60%), FEV1% 80  Past medical history Allergic asthma, Hypothyroidism  Past surgical history, Family history, Social history, Allergies all reviewed  Vital Signs BP 112/80 (BP Location: Left Arm, Cuff Size: Normal)   Pulse 83   Ht  (1.651 m)   Wt 222 lb 9.6 oz (101 kg)   SpO2 95%   BMI 37.04 kg/m   History of Present Illness Andrea Terrell is a 51 y.o. female with obstructive sleep apnea.  Since her last visit she had home sleep study.  This showed mild sleep apnea.  She was started on CPAP.  She is feeling much better and more alert.  No issues with mask fit.   She is not waking up as much to use the bathroom.  Physical Exam  General - No distress ENT - No sinus tenderness, no oral exudate, no LAN, MP 2, high arched palate Cardiac - s1s2 regular, no murmur Chest - No wheeze/rales/dullness Back - No focal tenderness Abd - Soft, non-tender Ext - No edema Neuro - Normal strength Skin - No rashes Psych - normal mood, and behavior   Assessment/Plan  Obstructive sleep apnea. - she is compliant with CPAP and reports benefit - continue auto CPAP   Obesity. - reviewed options to assist with weight loss   Patient Instructions  Follow up in 1 year    Coralyn Helling, MD [facility] Pager:  [phone] 06/06/2016, 10:40 AM

## 2016-06-18 DIAGNOSIS — J4540 Moderate persistent asthma, uncomplicated: Secondary | ICD-10-CM | POA: Diagnosis not present

## 2016-06-19 ENCOUNTER — Other Ambulatory Visit: Payer: Self-pay | Admitting: Allergy and Immunology

## 2016-06-19 DIAGNOSIS — J4551 Severe persistent asthma with (acute) exacerbation: Secondary | ICD-10-CM

## 2016-06-19 DIAGNOSIS — J309 Allergic rhinitis, unspecified: Secondary | ICD-10-CM

## 2016-06-26 ENCOUNTER — Ambulatory Visit (INDEPENDENT_AMBULATORY_CARE_PROVIDER_SITE_OTHER): Payer: BLUE CROSS/BLUE SHIELD | Admitting: Allergy and Immunology

## 2016-06-26 ENCOUNTER — Encounter: Payer: Self-pay | Admitting: Allergy and Immunology

## 2016-06-26 VITALS — BP 112/78 | HR 72 | Resp 16

## 2016-06-26 DIAGNOSIS — J4550 Severe persistent asthma, uncomplicated: Secondary | ICD-10-CM | POA: Diagnosis not present

## 2016-06-26 DIAGNOSIS — J3089 Other allergic rhinitis: Secondary | ICD-10-CM

## 2016-06-26 MED ORDER — MONTELUKAST SODIUM 10 MG PO TABS: 10.0000 mg | ORAL_TABLET | Freq: Every day | ORAL | 1 refills | Status: DC

## 2016-06-26 MED ORDER — FLUTICASONE-SALMETEROL 500-50 MCG/DOSE IN AEPB: 1.0000 | INHALATION_SPRAY | Freq: Two times a day (BID) | RESPIRATORY_TRACT | 1 refills | Status: DC

## 2016-06-26 MED ORDER — ALBUTEROL SULFATE HFA 108 (90 BASE) MCG/ACT IN AERS: 2.0000 | INHALATION_SPRAY | RESPIRATORY_TRACT | 0 refills | Status: DC | PRN

## 2016-06-26 MED ORDER — EPINEPHRINE 0.3 MG/0.3ML IJ SOAJ: 0.3000 mg | Freq: Once | INTRAMUSCULAR | 2 refills | Status: AC

## 2016-06-26 NOTE — Progress Notes (Signed)
Follow-up Note  Referring Provider: Paulina FusiSchultz, Douglas E, MD Primary Provider: Paulina FusiSCHULTZ,DOUGLAS E, MD Date of Office Visit: 06/26/2016  Subjective:   Andrea Terrell (DOB: [birth year]) is a 51 y.o. female who [facility] in re-evaluation of the following:  HPI: Andrea Terrell returns to this clinic in reevaluation of her severe asthma treated with Xolair and allergic rhinitis. I have not seen her in this clinic in over a year.  She has required 1 systemic steroid for an exacerbation in September 2017 but otherwise her asthma has been under very good control and she rarely uses a short acting bronchodilator and she can exercise without any difficulty and she can have cold air exposure without any difficulty. Her requirement for short acting bronchodilator averages out to 1 time per month. She only utilizes Advair when she notes that she has problems with wheezing and coughing. She may use this agent for a week or 2 a few times per year. She does note that if she misses her montelukast her requirement for short acting bronchodilator does increase.  She has had very little problems with her nose. She has not required an antibiotic to treat an episode of sinusitis. She uses Allegra on a consistent basis.  Her Xolair injections are going quite well. She has not had any adverse effects secondary to the use of this agent.  Allergies as of 06/26/2016      Reactions   Codeine Other (See Comments)   NERVOUSNESS   Penicillins Rash   Sulfa Antibiotics Rash      Medication List      albuterol 108 (90 Base) MCG/ACT inhaler Commonly known as:  PROAIR HFA Inhale 2 puffs into the lungs every 4 (four) hours as needed for wheezing or shortness of breath.   ALLEGRA ALLERGY 180 MG tablet Generic drug:  fexofenadine Take 180 mg by mouth daily as needed for allergies or rhinitis.   EFFEXOR PO Take by mouth.   EPIPEN 2-PAK 0.3 mg/0.3 mL Soaj injection Generic  drug:  EPINEPHrine Inject 0.3 mg into the muscle once.   Fluticasone-Salmeterol 500-50 MCG/DOSE Aepb Commonly known as:  ADVAIR Inhale 1 puff into the lungs 2 (two) times daily.   montelukast 10 MG tablet Commonly known as:  SINGULAIR TAKE 1 TABLET BY MOUTH DAILY AT BEDTIME.   omalizumab 150 MG injection Commonly known as:  XOLAIR Inject 300 mg into the skin every 28 (twenty-eight) days.   SYNTHROID PO Take 75 mcg by mouth daily.       Past Medical History:  Diagnosis Date  . Asthma   . Hypothyroid   . OSA (obstructive sleep apnea) 02/08/2016    Past Surgical History:  Procedure Laterality Date  . ABDOMINAL HERNIA REPAIR     age 89  . ADENOIDECTOMY    . CERVICAL ABLATION  2006  . FINGER SURGERY     age 89  . TONSILLECTOMY    . TUBAL LIGATION  1996    Review of systems negative except as noted in HPI / PMHx or noted below:  Review of Systems  Constitutional: Negative.   HENT: Negative.   Eyes: Negative.   Respiratory: Negative.   Cardiovascular: Negative.   Gastrointestinal: Negative.   Genitourinary: Negative.   Musculoskeletal: Negative.   Skin: Negative.   Neurological: Negative.   Endo/Heme/Allergies: Negative.   Psychiatric/Behavioral: Negative.      Objective:   Vitals:   06/26/16 0844  BP: 112/78  Pulse:  72  Resp: 16          Physical Exam  Constitutional: She is well-developed, well-nourished, and in no distress.  HENT:  Head: Normocephalic.  Right Ear: Tympanic membrane, external ear and ear canal normal.  Left Ear: Tympanic membrane, external ear and ear canal normal.  Nose: Nose normal. No mucosal edema or rhinorrhea.  Mouth/Throat: Uvula is midline, oropharynx is clear and moist and mucous membranes are normal. No oropharyngeal exudate.  Eyes: Conjunctivae are normal.  Neck: Trachea normal. No tracheal tenderness present. No tracheal deviation present. No thyromegaly present.  Cardiovascular: Normal rate, regular rhythm, S1  normal, S2 normal and normal heart sounds.   No murmur heard. Pulmonary/Chest: Breath sounds normal. No stridor. No respiratory distress. She has no wheezes. She has no rales.  Musculoskeletal: She exhibits no edema.  Lymphadenopathy:       Head (right side): No tonsillar adenopathy present.       Head (left side): No tonsillar adenopathy present.    She has no cervical adenopathy.  Neurological: She is alert. Gait normal.  Skin: No rash noted. She is not diaphoretic. No erythema. Nails show no clubbing.  Psychiatric: Mood and affect normal.    Diagnostics:    Spirometry was performed and demonstrated an FEV1 of 1.92 at 68 % of predicted.  Assessment and Plan:   1. Severe persistent asthma without complication   2. Other allergic rhinitis     1. Advair 500 one inhalation 1-2 times per day depending on disease activity  2. Montelukast 10 mg tablet 1 time per day  3. If needed:   A. OTC Allegra  B. Proventil HFA or similar 2 inhalations every 4-6 hour  4. Continue Xolair and AUVI-Q 0.3  5. Obtain flu vaccine every fall  6. Return to clinic in 6 months or earlier if problem  By every significant criteria except for one Andrea Terrell has very good control of her asthma. However, her spirometry is definitely depressed. I did have a talk with her today about the fact that she has probably adopted to a lower lung function as a result of her long-standing asthma and she may want to consistently use her Advair at least one time per day in an attempt to minimize any inflammation that is present within her chest. She has had an excellent response to Xolair in general and this has dramatically decreased her asthma activity and her exacerbation rate. She will continue to use all the therapy mentioned above and I will see her back in this clinic in 6 months or earlier if there is a problem.  [name], MD Allergy / Immunology [facility]

## 2016-06-26 NOTE — Patient Instructions (Addendum)
  1. Advair 500 one inhalation 1-2 times per day depending on disease activity  2. Montelukast 10 mg tablet 1 time per day  3. If needed:   A. OTC Allegra  B. Proventil HFA or similar 2 inhalations every 4-6 hour  4. Continue Xolair and AUVI-Q 0.3  5. Obtain flu vaccine every fall  6. Return to clinic in 6 months or earlier if problem

## 2016-07-11 DIAGNOSIS — M62830 Muscle spasm of back: Secondary | ICD-10-CM | POA: Diagnosis not present

## 2016-07-11 DIAGNOSIS — M9901 Segmental and somatic dysfunction of cervical region: Secondary | ICD-10-CM | POA: Diagnosis not present

## 2016-07-11 DIAGNOSIS — M542 Cervicalgia: Secondary | ICD-10-CM | POA: Diagnosis not present

## 2016-07-11 DIAGNOSIS — M9902 Segmental and somatic dysfunction of thoracic region: Secondary | ICD-10-CM | POA: Diagnosis not present

## 2016-07-18 DIAGNOSIS — J4540 Moderate persistent asthma, uncomplicated: Secondary | ICD-10-CM | POA: Diagnosis not present

## 2016-07-22 DIAGNOSIS — G4733 Obstructive sleep apnea (adult) (pediatric): Secondary | ICD-10-CM | POA: Diagnosis not present

## 2016-07-28 ENCOUNTER — Ambulatory Visit (INDEPENDENT_AMBULATORY_CARE_PROVIDER_SITE_OTHER): Payer: BLUE CROSS/BLUE SHIELD | Admitting: *Deleted

## 2016-07-28 DIAGNOSIS — J4550 Severe persistent asthma, uncomplicated: Secondary | ICD-10-CM | POA: Diagnosis not present

## 2016-07-31 DIAGNOSIS — G4733 Obstructive sleep apnea (adult) (pediatric): Secondary | ICD-10-CM | POA: Diagnosis not present

## 2016-08-08 DIAGNOSIS — M542 Cervicalgia: Secondary | ICD-10-CM | POA: Diagnosis not present

## 2016-08-08 DIAGNOSIS — M9902 Segmental and somatic dysfunction of thoracic region: Secondary | ICD-10-CM | POA: Diagnosis not present

## 2016-08-08 DIAGNOSIS — M62830 Muscle spasm of back: Secondary | ICD-10-CM | POA: Diagnosis not present

## 2016-08-08 DIAGNOSIS — M9901 Segmental and somatic dysfunction of cervical region: Secondary | ICD-10-CM | POA: Diagnosis not present

## 2016-08-15 ENCOUNTER — Other Ambulatory Visit: Payer: Self-pay | Admitting: Internal Medicine

## 2016-08-15 DIAGNOSIS — Z1231 Encounter for screening mammogram for malignant neoplasm of breast: Secondary | ICD-10-CM

## 2016-08-15 DIAGNOSIS — E039 Hypothyroidism, unspecified: Secondary | ICD-10-CM | POA: Diagnosis not present

## 2016-08-15 DIAGNOSIS — E785 Hyperlipidemia, unspecified: Secondary | ICD-10-CM | POA: Diagnosis not present

## 2016-08-15 DIAGNOSIS — M19071 Primary osteoarthritis, right ankle and foot: Secondary | ICD-10-CM | POA: Diagnosis not present

## 2016-08-15 DIAGNOSIS — M25571 Pain in right ankle and joints of right foot: Secondary | ICD-10-CM | POA: Diagnosis not present

## 2016-08-15 DIAGNOSIS — Z1389 Encounter for screening for other disorder: Secondary | ICD-10-CM | POA: Diagnosis not present

## 2016-08-15 DIAGNOSIS — E559 Vitamin D deficiency, unspecified: Secondary | ICD-10-CM | POA: Diagnosis not present

## 2016-08-15 DIAGNOSIS — Z0000 Encounter for general adult medical examination without abnormal findings: Secondary | ICD-10-CM | POA: Diagnosis not present

## 2016-08-20 DIAGNOSIS — J4540 Moderate persistent asthma, uncomplicated: Secondary | ICD-10-CM | POA: Diagnosis not present

## 2016-08-25 ENCOUNTER — Ambulatory Visit: Payer: BLUE CROSS/BLUE SHIELD

## 2016-08-28 ENCOUNTER — Ambulatory Visit (INDEPENDENT_AMBULATORY_CARE_PROVIDER_SITE_OTHER): Payer: BLUE CROSS/BLUE SHIELD | Admitting: *Deleted

## 2016-08-28 DIAGNOSIS — J4540 Moderate persistent asthma, uncomplicated: Secondary | ICD-10-CM | POA: Diagnosis not present

## 2016-09-05 ENCOUNTER — Other Ambulatory Visit: Payer: Self-pay | Admitting: Allergy and Immunology

## 2016-09-19 ENCOUNTER — Other Ambulatory Visit: Payer: Self-pay | Admitting: *Deleted

## 2016-09-19 MED ORDER — STERILE WATER FOR INJECTION IJ SOLN: 10.0000 mL | INTRAMUSCULAR | 11 refills | Status: DC

## 2016-09-20 DIAGNOSIS — J4540 Moderate persistent asthma, uncomplicated: Secondary | ICD-10-CM | POA: Diagnosis not present

## 2016-09-26 ENCOUNTER — Ambulatory Visit: Payer: BLUE CROSS/BLUE SHIELD

## 2016-09-26 DIAGNOSIS — M62830 Muscle spasm of back: Secondary | ICD-10-CM | POA: Diagnosis not present

## 2016-09-26 DIAGNOSIS — M9902 Segmental and somatic dysfunction of thoracic region: Secondary | ICD-10-CM | POA: Diagnosis not present

## 2016-09-26 DIAGNOSIS — M9901 Segmental and somatic dysfunction of cervical region: Secondary | ICD-10-CM | POA: Diagnosis not present

## 2016-09-26 DIAGNOSIS — M542 Cervicalgia: Secondary | ICD-10-CM | POA: Diagnosis not present

## 2016-10-03 DIAGNOSIS — M2021 Hallux rigidus, right foot: Secondary | ICD-10-CM | POA: Diagnosis not present

## 2016-10-08 ENCOUNTER — Ambulatory Visit (INDEPENDENT_AMBULATORY_CARE_PROVIDER_SITE_OTHER): Payer: BLUE CROSS/BLUE SHIELD | Admitting: *Deleted

## 2016-10-08 DIAGNOSIS — J4540 Moderate persistent asthma, uncomplicated: Secondary | ICD-10-CM | POA: Diagnosis not present

## 2016-10-23 DIAGNOSIS — J4540 Moderate persistent asthma, uncomplicated: Secondary | ICD-10-CM | POA: Diagnosis not present

## 2016-10-29 DIAGNOSIS — G4733 Obstructive sleep apnea (adult) (pediatric): Secondary | ICD-10-CM | POA: Diagnosis not present

## 2016-11-05 ENCOUNTER — Ambulatory Visit (INDEPENDENT_AMBULATORY_CARE_PROVIDER_SITE_OTHER): Payer: BLUE CROSS/BLUE SHIELD | Admitting: *Deleted

## 2016-11-05 DIAGNOSIS — J4540 Moderate persistent asthma, uncomplicated: Secondary | ICD-10-CM | POA: Diagnosis not present

## 2016-11-13 DIAGNOSIS — G4733 Obstructive sleep apnea (adult) (pediatric): Secondary | ICD-10-CM | POA: Diagnosis not present

## 2016-11-28 DIAGNOSIS — M9901 Segmental and somatic dysfunction of cervical region: Secondary | ICD-10-CM | POA: Diagnosis not present

## 2016-11-28 DIAGNOSIS — M62830 Muscle spasm of back: Secondary | ICD-10-CM | POA: Diagnosis not present

## 2016-11-28 DIAGNOSIS — M9902 Segmental and somatic dysfunction of thoracic region: Secondary | ICD-10-CM | POA: Diagnosis not present

## 2016-11-28 DIAGNOSIS — M542 Cervicalgia: Secondary | ICD-10-CM | POA: Diagnosis not present

## 2016-12-03 ENCOUNTER — Ambulatory Visit (INDEPENDENT_AMBULATORY_CARE_PROVIDER_SITE_OTHER): Payer: BLUE CROSS/BLUE SHIELD | Admitting: *Deleted

## 2016-12-03 DIAGNOSIS — J4540 Moderate persistent asthma, uncomplicated: Secondary | ICD-10-CM

## 2016-12-05 DIAGNOSIS — M2021 Hallux rigidus, right foot: Secondary | ICD-10-CM | POA: Diagnosis not present

## 2016-12-10 DIAGNOSIS — H1032 Unspecified acute conjunctivitis, left eye: Secondary | ICD-10-CM | POA: Diagnosis not present

## 2016-12-11 DIAGNOSIS — B0052 Herpesviral keratitis: Secondary | ICD-10-CM | POA: Diagnosis not present

## 2016-12-13 DIAGNOSIS — G4733 Obstructive sleep apnea (adult) (pediatric): Secondary | ICD-10-CM | POA: Diagnosis not present

## 2016-12-15 DIAGNOSIS — B0052 Herpesviral keratitis: Secondary | ICD-10-CM | POA: Diagnosis not present

## 2016-12-16 ENCOUNTER — Other Ambulatory Visit: Payer: Self-pay | Admitting: Allergy and Immunology

## 2016-12-16 DIAGNOSIS — J4550 Severe persistent asthma, uncomplicated: Secondary | ICD-10-CM

## 2016-12-19 DIAGNOSIS — M2021 Hallux rigidus, right foot: Secondary | ICD-10-CM | POA: Diagnosis not present

## 2016-12-19 DIAGNOSIS — M9902 Segmental and somatic dysfunction of thoracic region: Secondary | ICD-10-CM | POA: Diagnosis not present

## 2016-12-19 DIAGNOSIS — M542 Cervicalgia: Secondary | ICD-10-CM | POA: Diagnosis not present

## 2016-12-19 DIAGNOSIS — M62830 Muscle spasm of back: Secondary | ICD-10-CM | POA: Diagnosis not present

## 2016-12-19 DIAGNOSIS — M9901 Segmental and somatic dysfunction of cervical region: Secondary | ICD-10-CM | POA: Diagnosis not present

## 2016-12-25 DIAGNOSIS — J4540 Moderate persistent asthma, uncomplicated: Secondary | ICD-10-CM | POA: Diagnosis not present

## 2016-12-31 ENCOUNTER — Ambulatory Visit (INDEPENDENT_AMBULATORY_CARE_PROVIDER_SITE_OTHER): Payer: BLUE CROSS/BLUE SHIELD | Admitting: *Deleted

## 2016-12-31 DIAGNOSIS — J4540 Moderate persistent asthma, uncomplicated: Secondary | ICD-10-CM | POA: Diagnosis not present

## 2017-01-13 DIAGNOSIS — G4733 Obstructive sleep apnea (adult) (pediatric): Secondary | ICD-10-CM | POA: Diagnosis not present

## 2017-01-14 ENCOUNTER — Encounter: Payer: Self-pay | Admitting: Allergy and Immunology

## 2017-01-14 ENCOUNTER — Ambulatory Visit: Payer: BLUE CROSS/BLUE SHIELD | Admitting: Allergy and Immunology

## 2017-01-14 VITALS — BP 120/82 | HR 120 | Resp 18

## 2017-01-14 DIAGNOSIS — J4550 Severe persistent asthma, uncomplicated: Secondary | ICD-10-CM

## 2017-01-14 DIAGNOSIS — J3089 Other allergic rhinitis: Secondary | ICD-10-CM

## 2017-01-14 MED ORDER — FLUTICASONE FUROATE-VILANTEROL 200-25 MCG/INH IN AEPB: 1.0000 | INHALATION_SPRAY | Freq: Every day | RESPIRATORY_TRACT | 5 refills | Status: DC

## 2017-01-14 NOTE — Progress Notes (Signed)
Follow-up Note  Referring Provider: Paulina FusiSchultz, Douglas E, MD Primary Provider: Paulina FusiSchultz, Douglas E, MD Date of Office Visit: 01/14/2017  Subjective:   Andrea Terrell (DOB: 1966/01/11) is a 51 y.o. female who [facility]2017 in re-evaluation of the following:  HPI: Andrea Terrell return to this clinic in reevaluation of her severe asthma treated with Xolair. Her last visit to this clinic was 26 Jun 2016.  She has had a relatively good year.  She has not required a systemic steroid or an antibiotic to treat any type of respiratory tract issue.  Her requirement for short acting bronchodilator is about twice a week.  She continues on Xolair and has not had any adverse effect from utilizing this medication.  She believes that montelukast is a requirement for if she misses this medication she developed problems with coughing and wheezing.  She is not sure that her Advair is working very well because she has difficulty using Advair twice a day because of logistical issue.  Allergies as of 01/14/2017      Reactions   Codeine Other (See Comments)   NERVOUSNESS   Penicillins Rash   Sulfa Antibiotics Rash      Medication List      albuterol 108 (90 Base) MCG/ACT inhaler Commonly known as:  PROAIR HFA Inhale 2 puffs into the lungs every 4 (four) hours as needed for wheezing or shortness of breath.   ALLEGRA ALLERGY 180 MG tablet Generic drug:  fexofenadine Take 180 mg by mouth daily as needed for allergies or rhinitis.   EFFEXOR PO Take by mouth.   Fluticasone-Salmeterol 500-50 MCG/DOSE Aepb Commonly known as:  ADVAIR Inhale 1 puff into the lungs 2 (two) times daily.   montelukast 10 MG tablet Commonly known as:  SINGULAIR TAKE 1 TABLET BY MOUTH ONCE DAILY   sterile water (preservative free) injection Inject 10 mLs as directed as directed.   SYNTHROID PO Take 75 mcg by mouth daily.   XOLAIR 150 MG injection Generic drug:  omalizumab INJECT  300MG  SUBCUTANEOUSLY EVERY 28 DAYS.       Past Medical History:  Diagnosis Date  . Asthma   . Hypothyroid   . OSA (obstructive sleep apnea) 02/08/2016    Past Surgical History:  Procedure Laterality Date  . ABDOMINAL HERNIA REPAIR     age 89  . ADENOIDECTOMY    . CERVICAL ABLATION  2006  . FINGER SURGERY     age 89  . TONSILLECTOMY    . TUBAL LIGATION  1996    Review of systems negative except as noted in HPI / PMHx or noted below:  Review of Systems  Constitutional: Negative.   HENT: Negative.   Eyes: Negative.   Respiratory: Negative.   Cardiovascular: Negative.   Gastrointestinal: Negative.   Genitourinary: Negative.   Musculoskeletal: Negative.   Skin: Negative.   Neurological: Negative.   Endo/Heme/Allergies: Negative.   Psychiatric/Behavioral: Negative.      Objective:   Vitals:   01/14/17 1525  BP: 120/82  Pulse: (!) 120  Resp: 18          Physical Exam  Constitutional: She is well-developed, well-nourished, and in no distress.  HENT:  Head: Normocephalic.  Right Ear: Tympanic membrane, external ear and ear canal normal.  Left Ear: Tympanic membrane, external ear and ear canal normal.  Nose: Nose normal. No mucosal edema or rhinorrhea.  Mouth/Throat: Uvula is midline, oropharynx is clear and moist and mucous  membranes are normal. No oropharyngeal exudate.  Eyes: Conjunctivae are normal.  Neck: Trachea normal. No tracheal tenderness present. No tracheal deviation present. No thyromegaly present.  Cardiovascular: Normal rate, regular rhythm, S1 normal, S2 normal and normal heart sounds.  No murmur heard. Pulmonary/Chest: Breath sounds normal. No stridor. No respiratory distress. She has no wheezes. She has no rales.  Musculoskeletal: She exhibits no edema.  Lymphadenopathy:       Head (right side): No tonsillar adenopathy present.       Head (left side): No tonsillar adenopathy present.    She has no cervical adenopathy.  Neurological: She is  alert. Gait normal.  Skin: No rash noted. She is not diaphoretic. No erythema. Nails show no clubbing.  Psychiatric: Mood and affect normal.    Diagnostics:    Spirometry was performed and demonstrated an FEV1 of 2.16 at 75 % of predicted.  The patient had an Asthma Control Test with the following results: ACT Total Score: 19.    Assessment and Plan:   1. Asthma, severe persistent, well-controlled   2. Other allergic rhinitis     1. BREO 200 one inhalation 1 times per day. Coupon  2. Montelukast 10 mg tablet 1 time per day  3. If needed:   A. OTC Allegra  B. Proventil HFA or similar 2 inhalations every 4-6 hour  4. Continue Xolair and AUVI-Q 0.3  5. Return to clinic in 6 months or earlier if problem  Andrea Terrell will continue on omalizumab injections and inhaled steroid with a long-acting bronchodilator and a leukotriene modifier and I will assume that she will do well over the course the next 6 months and see her back in his clinic at that point in time or earlier if there is a problem.  Andrea SchimkeEric Stanley Helmuth, MD Allergy / Immunology [facility]

## 2017-01-14 NOTE — Patient Instructions (Addendum)
  1. BREO 200 one inhalation 1 times per day. Coupon  2. Montelukast 10 mg tablet 1 time per day  3. If needed:   A. OTC Allegra  B. Proventil HFA or similar 2 inhalations every 4-6 hour  4. Continue Xolair and AUVI-Q 0.3  5. Return to clinic in 6 months or earlier if problem

## 2017-01-19 ENCOUNTER — Encounter: Payer: Self-pay | Admitting: Allergy and Immunology

## 2017-01-23 DIAGNOSIS — Z882 Allergy status to sulfonamides status: Secondary | ICD-10-CM | POA: Diagnosis not present

## 2017-01-23 DIAGNOSIS — Z7951 Long term (current) use of inhaled steroids: Secondary | ICD-10-CM | POA: Diagnosis not present

## 2017-01-23 DIAGNOSIS — J45909 Unspecified asthma, uncomplicated: Secondary | ICD-10-CM | POA: Diagnosis not present

## 2017-01-23 DIAGNOSIS — Z880 Allergy status to penicillin: Secondary | ICD-10-CM | POA: Diagnosis not present

## 2017-01-23 DIAGNOSIS — E039 Hypothyroidism, unspecified: Secondary | ICD-10-CM | POA: Diagnosis not present

## 2017-01-23 DIAGNOSIS — M2021 Hallux rigidus, right foot: Secondary | ICD-10-CM | POA: Diagnosis not present

## 2017-01-23 DIAGNOSIS — Z87891 Personal history of nicotine dependence: Secondary | ICD-10-CM | POA: Diagnosis not present

## 2017-01-23 DIAGNOSIS — G4730 Sleep apnea, unspecified: Secondary | ICD-10-CM | POA: Diagnosis not present

## 2017-01-23 DIAGNOSIS — Z79899 Other long term (current) drug therapy: Secondary | ICD-10-CM | POA: Diagnosis not present

## 2017-01-23 DIAGNOSIS — Z885 Allergy status to narcotic agent status: Secondary | ICD-10-CM | POA: Diagnosis not present

## 2017-01-23 DIAGNOSIS — Z6838 Body mass index (BMI) 38.0-38.9, adult: Secondary | ICD-10-CM | POA: Diagnosis not present

## 2017-01-24 DIAGNOSIS — J4540 Moderate persistent asthma, uncomplicated: Secondary | ICD-10-CM | POA: Diagnosis not present

## 2017-01-28 DIAGNOSIS — G4733 Obstructive sleep apnea (adult) (pediatric): Secondary | ICD-10-CM | POA: Diagnosis not present

## 2017-02-12 DIAGNOSIS — G4733 Obstructive sleep apnea (adult) (pediatric): Secondary | ICD-10-CM | POA: Diagnosis not present

## 2017-02-20 DIAGNOSIS — M9902 Segmental and somatic dysfunction of thoracic region: Secondary | ICD-10-CM | POA: Diagnosis not present

## 2017-02-20 DIAGNOSIS — M542 Cervicalgia: Secondary | ICD-10-CM | POA: Diagnosis not present

## 2017-02-20 DIAGNOSIS — M9901 Segmental and somatic dysfunction of cervical region: Secondary | ICD-10-CM | POA: Diagnosis not present

## 2017-02-20 DIAGNOSIS — M62830 Muscle spasm of back: Secondary | ICD-10-CM | POA: Diagnosis not present

## 2017-03-09 DIAGNOSIS — R1011 Right upper quadrant pain: Secondary | ICD-10-CM | POA: Diagnosis not present

## 2017-03-09 DIAGNOSIS — N23 Unspecified renal colic: Secondary | ICD-10-CM | POA: Diagnosis not present

## 2017-03-09 DIAGNOSIS — R319 Hematuria, unspecified: Secondary | ICD-10-CM | POA: Diagnosis not present

## 2017-03-09 DIAGNOSIS — R1111 Vomiting without nausea: Secondary | ICD-10-CM | POA: Diagnosis not present

## 2017-03-09 DIAGNOSIS — N83202 Unspecified ovarian cyst, left side: Secondary | ICD-10-CM | POA: Diagnosis not present

## 2017-03-09 DIAGNOSIS — Z6835 Body mass index (BMI) 35.0-35.9, adult: Secondary | ICD-10-CM | POA: Diagnosis not present

## 2017-03-09 DIAGNOSIS — N390 Urinary tract infection, site not specified: Secondary | ICD-10-CM | POA: Diagnosis not present

## 2017-03-12 DIAGNOSIS — L570 Actinic keratosis: Secondary | ICD-10-CM | POA: Diagnosis not present

## 2017-03-13 DIAGNOSIS — N201 Calculus of ureter: Secondary | ICD-10-CM | POA: Diagnosis not present

## 2017-03-13 DIAGNOSIS — N3000 Acute cystitis without hematuria: Secondary | ICD-10-CM | POA: Diagnosis not present

## 2017-03-15 DIAGNOSIS — G4733 Obstructive sleep apnea (adult) (pediatric): Secondary | ICD-10-CM | POA: Diagnosis not present

## 2017-03-16 DIAGNOSIS — J4540 Moderate persistent asthma, uncomplicated: Secondary | ICD-10-CM | POA: Diagnosis not present

## 2017-03-18 ENCOUNTER — Ambulatory Visit: Payer: BLUE CROSS/BLUE SHIELD

## 2017-03-18 ENCOUNTER — Ambulatory Visit (INDEPENDENT_AMBULATORY_CARE_PROVIDER_SITE_OTHER): Payer: BLUE CROSS/BLUE SHIELD

## 2017-03-18 DIAGNOSIS — J4540 Moderate persistent asthma, uncomplicated: Secondary | ICD-10-CM

## 2017-03-27 DIAGNOSIS — M542 Cervicalgia: Secondary | ICD-10-CM | POA: Diagnosis not present

## 2017-03-27 DIAGNOSIS — M9901 Segmental and somatic dysfunction of cervical region: Secondary | ICD-10-CM | POA: Diagnosis not present

## 2017-03-27 DIAGNOSIS — M9902 Segmental and somatic dysfunction of thoracic region: Secondary | ICD-10-CM | POA: Diagnosis not present

## 2017-03-27 DIAGNOSIS — N83201 Unspecified ovarian cyst, right side: Secondary | ICD-10-CM | POA: Diagnosis not present

## 2017-03-27 DIAGNOSIS — M62830 Muscle spasm of back: Secondary | ICD-10-CM | POA: Diagnosis not present

## 2017-04-02 ENCOUNTER — Other Ambulatory Visit: Payer: Self-pay | Admitting: Allergy and Immunology

## 2017-04-02 DIAGNOSIS — J4550 Severe persistent asthma, uncomplicated: Secondary | ICD-10-CM

## 2017-04-04 DIAGNOSIS — G4733 Obstructive sleep apnea (adult) (pediatric): Secondary | ICD-10-CM | POA: Diagnosis not present

## 2017-04-10 DIAGNOSIS — N83202 Unspecified ovarian cyst, left side: Secondary | ICD-10-CM | POA: Diagnosis not present

## 2017-04-10 DIAGNOSIS — N83209 Unspecified ovarian cyst, unspecified side: Secondary | ICD-10-CM | POA: Diagnosis not present

## 2017-04-15 ENCOUNTER — Ambulatory Visit: Payer: BLUE CROSS/BLUE SHIELD

## 2017-04-15 DIAGNOSIS — J4540 Moderate persistent asthma, uncomplicated: Secondary | ICD-10-CM | POA: Diagnosis not present

## 2017-04-15 DIAGNOSIS — G4733 Obstructive sleep apnea (adult) (pediatric): Secondary | ICD-10-CM | POA: Diagnosis not present

## 2017-04-16 ENCOUNTER — Ambulatory Visit (INDEPENDENT_AMBULATORY_CARE_PROVIDER_SITE_OTHER): Payer: BLUE CROSS/BLUE SHIELD | Admitting: *Deleted

## 2017-04-16 DIAGNOSIS — J4540 Moderate persistent asthma, uncomplicated: Secondary | ICD-10-CM

## 2017-05-13 ENCOUNTER — Ambulatory Visit (INDEPENDENT_AMBULATORY_CARE_PROVIDER_SITE_OTHER): Payer: BLUE CROSS/BLUE SHIELD | Admitting: *Deleted

## 2017-05-13 DIAGNOSIS — J4540 Moderate persistent asthma, uncomplicated: Secondary | ICD-10-CM | POA: Diagnosis not present

## 2017-05-13 DIAGNOSIS — G4733 Obstructive sleep apnea (adult) (pediatric): Secondary | ICD-10-CM | POA: Diagnosis not present

## 2017-05-14 DIAGNOSIS — J4540 Moderate persistent asthma, uncomplicated: Secondary | ICD-10-CM | POA: Diagnosis not present

## 2017-05-22 DIAGNOSIS — M9902 Segmental and somatic dysfunction of thoracic region: Secondary | ICD-10-CM | POA: Diagnosis not present

## 2017-05-22 DIAGNOSIS — M9901 Segmental and somatic dysfunction of cervical region: Secondary | ICD-10-CM | POA: Diagnosis not present

## 2017-05-22 DIAGNOSIS — M62830 Muscle spasm of back: Secondary | ICD-10-CM | POA: Diagnosis not present

## 2017-05-22 DIAGNOSIS — M542 Cervicalgia: Secondary | ICD-10-CM | POA: Diagnosis not present

## 2017-05-26 DIAGNOSIS — G4733 Obstructive sleep apnea (adult) (pediatric): Secondary | ICD-10-CM | POA: Diagnosis not present

## 2017-06-11 ENCOUNTER — Ambulatory Visit (INDEPENDENT_AMBULATORY_CARE_PROVIDER_SITE_OTHER): Payer: BLUE CROSS/BLUE SHIELD | Admitting: *Deleted

## 2017-06-11 DIAGNOSIS — J4540 Moderate persistent asthma, uncomplicated: Secondary | ICD-10-CM

## 2017-06-12 DIAGNOSIS — J4540 Moderate persistent asthma, uncomplicated: Secondary | ICD-10-CM | POA: Diagnosis not present

## 2017-06-13 DIAGNOSIS — G4733 Obstructive sleep apnea (adult) (pediatric): Secondary | ICD-10-CM | POA: Diagnosis not present

## 2017-06-19 ENCOUNTER — Ambulatory Visit: Payer: BLUE CROSS/BLUE SHIELD | Admitting: Pulmonary Disease

## 2017-06-26 ENCOUNTER — Ambulatory Visit: Payer: BLUE CROSS/BLUE SHIELD | Admitting: Pulmonary Disease

## 2017-06-26 ENCOUNTER — Encounter: Payer: Self-pay | Admitting: Pulmonary Disease

## 2017-06-26 VITALS — BP 120/86 | HR 78 | Ht 65.0 in | Wt 193.0 lb

## 2017-06-26 DIAGNOSIS — G4733 Obstructive sleep apnea (adult) (pediatric): Secondary | ICD-10-CM

## 2017-06-26 DIAGNOSIS — Z9989 Dependence on other enabling machines and devices: Secondary | ICD-10-CM | POA: Diagnosis not present

## 2017-06-26 NOTE — Patient Instructions (Signed)
Follow up in 1 year.

## 2017-06-26 NOTE — Progress Notes (Signed)
[facility]  Chief Complaint  Patient presents with  . Follow-up    Feels it is going well.    Vital signs: BP 120/86 (BP Location: Left Arm, Cuff Size: Normal)   Pulse 78   Ht  (1.651 m)   Wt 193 lb (87.5 kg)   SpO2 99%   BMI 32.12 kg/m   History of Present Illness: Andrea Terrell is a 52 y.o. female with obstructive sleep apnea.  She changed her diet and started exercising.  She has lost about 25 lbs since last year.    She still uses CPAP nightly.  Feels like this still helps her sleep and energy level.  No issues with mask fit.  Physical Exam:  General - pleasant Eyes - pupils reactive ENT - no sinus tenderness, no oral exudate, no LAN Cardiac - regular, no murmur Chest - no wheeze, rales Abd - soft, non tender Ext - no edema Skin - no rashes Neuro - normal strength Psych - normal mood   Assessment/Plan:  Obstructive sleep apnea. - she is compliant with CPAP and reports benefit from therapy - continue auto CPAP - if she continues to make progress with weight loss, then might be able to reassess whether she still needs CPAP  Allergic asthma. - she is followed by Dr. Lucie Leather   Patient Instructions  Follow up in 1 year    Coralyn Helling, MD [facility] Pulmonary/Critical Care 06/26/2017, 10:59 AM  Flow Sheet  Sleep tests: HST 02/07/16 >> AHI 7.6, SaO2 low 76% Auto CPAP 05/26/17 to 06/24/17 >> used on 30 of 30 nights with average 8 hrs 39 min.  Average AHI 2 with median CPAP 8 and 95 th percentile CPAP 10 cm H2O  Pulmonary tests Spirometry 11/22/15 >> FEV1 1.70 (60%), FEV1% 80  Past Medical History: She  has a past medical history of Asthma, Hypothyroid, and OSA (obstructive sleep apnea) (02/08/2016).  Past Surgical History: She  has a past surgical history that includes Adenoidectomy; Tonsillectomy; Abdominal hernia repair; Finger surgery; Tubal ligation (1996); and Cervical ablation (2006).  Family  History: Her family history includes COPD in her maternal uncle and mother; Diabetes in her maternal aunt; Heart attack in her maternal grandfather and paternal grandfather; Heart disease in her brother and mother; High blood pressure in her father; Stroke in her paternal grandmother.  Social History: She  reports that she quit smoking about 30 years ago. Her smoking use included cigarettes. She has a 2.50 pack-year smoking history. She has never used smokeless tobacco. She reports that she does not drink alcohol or use drugs.  Medications: Allergies as of 06/26/2017      Reactions   Codeine Other (See Comments)   NERVOUSNESS   Penicillins Rash   Sulfa Antibiotics Rash      Medication List        Accurate as of 06/26/17 10:59 AM. Always use your most recent med list.          albuterol 108 (90 Base) MCG/ACT inhaler Commonly known as:  PROAIR HFA Inhale 2 puffs into the lungs every 4 (four) hours as needed for wheezing or shortness of breath.   ALLEGRA ALLERGY 180 MG tablet Generic drug:  fexofenadine Take 180 mg by mouth daily as needed for allergies or rhinitis.   EFFEXOR PO Take by mouth.   fluticasone furoate-vilanterol 200-25 MCG/INH Aepb Commonly known as:  BREO ELLIPTA Inhale 1 puff into the lungs daily.   montelukast 10  MG tablet Commonly known as:  SINGULAIR TAKE 1 TABLET BY MOUTH ONCE DAILY. **NEEDS APPOINTMENT**   sterile water (preservative free) injection Inject 10 mLs as directed as directed.   SYNTHROID PO Take 75 mcg by mouth daily.   XOLAIR 150 MG injection Generic drug:  omalizumab INJECT  SUBCUTANEOUSLY EVERY 28 DAYS.

## 2017-06-29 ENCOUNTER — Other Ambulatory Visit: Payer: Self-pay | Admitting: Allergy and Immunology

## 2017-06-29 DIAGNOSIS — J4550 Severe persistent asthma, uncomplicated: Secondary | ICD-10-CM

## 2017-07-10 DIAGNOSIS — M62830 Muscle spasm of back: Secondary | ICD-10-CM | POA: Diagnosis not present

## 2017-07-10 DIAGNOSIS — M9902 Segmental and somatic dysfunction of thoracic region: Secondary | ICD-10-CM | POA: Diagnosis not present

## 2017-07-10 DIAGNOSIS — M9901 Segmental and somatic dysfunction of cervical region: Secondary | ICD-10-CM | POA: Diagnosis not present

## 2017-07-10 DIAGNOSIS — M542 Cervicalgia: Secondary | ICD-10-CM | POA: Diagnosis not present

## 2017-07-13 DIAGNOSIS — G4733 Obstructive sleep apnea (adult) (pediatric): Secondary | ICD-10-CM | POA: Diagnosis not present

## 2017-07-14 ENCOUNTER — Ambulatory Visit (INDEPENDENT_AMBULATORY_CARE_PROVIDER_SITE_OTHER): Payer: BLUE CROSS/BLUE SHIELD | Admitting: *Deleted

## 2017-07-14 DIAGNOSIS — J4540 Moderate persistent asthma, uncomplicated: Secondary | ICD-10-CM | POA: Diagnosis not present

## 2017-07-15 ENCOUNTER — Ambulatory Visit: Payer: BLUE CROSS/BLUE SHIELD | Admitting: Allergy and Immunology

## 2017-07-15 ENCOUNTER — Ambulatory Visit: Payer: BLUE CROSS/BLUE SHIELD

## 2017-08-06 DIAGNOSIS — L719 Rosacea, unspecified: Secondary | ICD-10-CM | POA: Diagnosis not present

## 2017-08-06 DIAGNOSIS — L219 Seborrheic dermatitis, unspecified: Secondary | ICD-10-CM | POA: Diagnosis not present

## 2017-08-07 DIAGNOSIS — G4733 Obstructive sleep apnea (adult) (pediatric): Secondary | ICD-10-CM | POA: Diagnosis not present

## 2017-08-13 DIAGNOSIS — J4540 Moderate persistent asthma, uncomplicated: Secondary | ICD-10-CM | POA: Diagnosis not present

## 2017-08-13 DIAGNOSIS — G4733 Obstructive sleep apnea (adult) (pediatric): Secondary | ICD-10-CM | POA: Diagnosis not present

## 2017-08-17 ENCOUNTER — Ambulatory Visit: Payer: BLUE CROSS/BLUE SHIELD | Admitting: Allergy and Immunology

## 2017-08-17 ENCOUNTER — Encounter: Payer: Self-pay | Admitting: Allergy and Immunology

## 2017-08-17 ENCOUNTER — Ambulatory Visit: Payer: Self-pay

## 2017-08-17 VITALS — BP 142/92 | HR 84 | Resp 18

## 2017-08-17 DIAGNOSIS — J3089 Other allergic rhinitis: Secondary | ICD-10-CM | POA: Diagnosis not present

## 2017-08-17 DIAGNOSIS — J4550 Severe persistent asthma, uncomplicated: Secondary | ICD-10-CM

## 2017-08-17 NOTE — Patient Instructions (Addendum)
  1. Continue BREO 200 one inhalation 1 times per day  2. Continue Montelukast 10 mg tablet 1 time per day  3.  Continue Xolair and AUVI-Q 0.3  4. If needed:   A. OTC Allegra  B. Proventil HFA or similar 2 inhalations every 4-6 hour  5. Return to clinic in 6 months or earlier if problem  6. Obtain fall flu vaccine

## 2017-08-17 NOTE — Progress Notes (Signed)
Follow-up Note  Referring Provider: Paulina FusiSchultz, Douglas E, MD Primary Provider: Paulina FusiSchultz, Douglas E, MD Date of Office Visit: 08/17/2017  Subjective:   Andrea Terrell (DOB: Oct 28, 1965) is a 52 y.o. female who [facility] 08[facility]17/2017 in re-evaluation of the following:  HPI: Andrea Terrell returns to this clinic in reevaluation of her asthma treated with a combination of Breo, montelukast, and Xolair and a history of allergic rhinitis.  Her last visit to this clinic was 14 January 2017.  Overall she has had an excellent interval of time regarding her asthma.  Rarely does use a short acting bronchodilator and she has not had to utilize a systemic steroid or an antibiotic to treat any type of respiratory tract issue.  She has lost approximately 40 pounds of weight voluntarily and feels much better while doing so.  Her Xolair injections are going well without any adverse effect.  She has had almost no issues with her upper airway.  She continues on CPAP followed by Dr. Craige CottaSood.  Allergies as of 08/17/2017      Reactions   Codeine Other (See Comments)   NERVOUSNESS   Penicillins Rash   Sulfa Antibiotics Rash      Medication List      albuterol 108 (90 Base) MCG/ACT inhaler Commonly known as:  PROAIR HFA Inhale 2 puffs into the lungs every 4 (four) hours as needed for wheezing or shortness of breath.   ALLEGRA ALLERGY 180 MG tablet Generic drug:  fexofenadine Take 180 mg by mouth daily as needed for allergies or rhinitis.   EFFEXOR PO Take by mouth.   fluticasone furoate-vilanterol 200-25 MCG/INH Aepb Commonly known as:  BREO ELLIPTA Inhale 1 puff into the lungs daily.   montelukast 10 MG tablet Commonly known as:  SINGULAIR TAKE 1 TABLET BY MOUTH ONCE DAILY. **NEEDS APPOINTMENT**   sterile water (preservative free) injection Inject 10 mLs as directed as directed.   SYNTHROID PO Take 75 mcg by mouth daily.   XOLAIR 150 MG injection Generic drug:   omalizumab INJECT 300MG  SUBCUTANEOUSLY EVERY 28 DAYS.       Past Medical History:  Diagnosis Date  . Asthma   . Hypothyroid   . OSA (obstructive sleep apnea) 02/08/2016    Past Surgical History:  Procedure Laterality Date  . ABDOMINAL HERNIA REPAIR     age 89  . ADENOIDECTOMY    . CERVICAL ABLATION  2006  . FINGER SURGERY     age 89  . TONSILLECTOMY    . TUBAL LIGATION  1996    Review of systems negative except as noted in HPI / PMHx or noted below:  Review of Systems  Constitutional: Negative.   HENT: Negative.   Eyes: Negative.   Respiratory: Negative.   Cardiovascular: Negative.   Gastrointestinal: Negative.   Genitourinary: Negative.   Musculoskeletal: Negative.   Skin: Negative.   Neurological: Negative.   Endo/Heme/Allergies: Negative.   Psychiatric/Behavioral: Negative.      Objective:   Vitals:   08/17/17 1628  BP: (!) 142/92  Pulse: 84  Resp: 18          Physical Exam  HENT:  Head: Normocephalic.  Right Ear: Tympanic membrane, external ear and ear canal normal.  Left Ear: Tympanic membrane, external ear and ear canal normal.  Nose: Nose normal. No mucosal edema or rhinorrhea.  Mouth/Throat: Uvula is midline, oropharynx is clear and moist and mucous membranes are normal. No oropharyngeal exudate.  Eyes:  Conjunctivae are normal.  Neck: Trachea normal. No tracheal tenderness present. No tracheal deviation present. No thyromegaly present.  Cardiovascular: Normal rate, regular rhythm, S1 normal, S2 normal and normal heart sounds.  No murmur heard. Pulmonary/Chest: Breath sounds normal. No stridor. No respiratory distress. She has no wheezes. She has no rales.  Musculoskeletal: She exhibits no edema.  Lymphadenopathy:       Head (right side): No tonsillar adenopathy present.       Head (left side): No tonsillar adenopathy present.    She has no cervical adenopathy.  Neurological: She is alert.  Skin: No rash noted. She is not diaphoretic. No  erythema. Nails show no clubbing.    Diagnostics:    Spirometry was performed and demonstrated an FEV1 of 2.25 at 79 % of predicted.  The patient had an Asthma Control Test with the following results: ACT Total Score: 22.    Assessment and Plan:   1. Asthma, severe persistent, well-controlled   2. Other allergic rhinitis     1. Continue BREO 200 one inhalation 1 times per day  2. Continue Montelukast 10 mg tablet 1 time per day  3.  Continue Xolair and AUVI-Q 0.3  4. If needed:   A. OTC Allegra  B. Proventil HFA or similar 2 inhalations every 4-6 hour  5. Return to clinic in 6 months or earlier if problem  6. Obtain fall flu vaccine  Andrea Terrell appears to be doing quite well on her current plan which includes omalizumab as well as anti-inflammatory agents for her respiratory track and she will continue on this plan and I will see her back in this clinic in 6 months or earlier if there is a problem.  Andrea Schimke, MD Allergy / Immunology [facility]

## 2017-08-18 ENCOUNTER — Encounter: Payer: Self-pay | Admitting: Allergy and Immunology

## 2017-08-21 DIAGNOSIS — M62830 Muscle spasm of back: Secondary | ICD-10-CM | POA: Diagnosis not present

## 2017-08-21 DIAGNOSIS — M9901 Segmental and somatic dysfunction of cervical region: Secondary | ICD-10-CM | POA: Diagnosis not present

## 2017-08-21 DIAGNOSIS — M542 Cervicalgia: Secondary | ICD-10-CM | POA: Diagnosis not present

## 2017-08-21 DIAGNOSIS — M9902 Segmental and somatic dysfunction of thoracic region: Secondary | ICD-10-CM | POA: Diagnosis not present

## 2017-08-24 DIAGNOSIS — G4733 Obstructive sleep apnea (adult) (pediatric): Secondary | ICD-10-CM | POA: Diagnosis not present

## 2017-08-28 DIAGNOSIS — Z0000 Encounter for general adult medical examination without abnormal findings: Secondary | ICD-10-CM | POA: Diagnosis not present

## 2017-08-28 DIAGNOSIS — E039 Hypothyroidism, unspecified: Secondary | ICD-10-CM | POA: Diagnosis not present

## 2017-08-28 DIAGNOSIS — E785 Hyperlipidemia, unspecified: Secondary | ICD-10-CM | POA: Diagnosis not present

## 2017-08-28 DIAGNOSIS — E559 Vitamin D deficiency, unspecified: Secondary | ICD-10-CM | POA: Diagnosis not present

## 2017-08-28 DIAGNOSIS — F329 Major depressive disorder, single episode, unspecified: Secondary | ICD-10-CM | POA: Diagnosis not present

## 2017-08-31 ENCOUNTER — Other Ambulatory Visit: Payer: Self-pay | Admitting: Allergy and Immunology

## 2017-09-01 ENCOUNTER — Other Ambulatory Visit: Payer: Self-pay

## 2017-09-01 ENCOUNTER — Telehealth: Payer: Self-pay | Admitting: Allergy and Immunology

## 2017-09-01 MED ORDER — FLUTICASONE FUROATE-VILANTEROL 200-25 MCG/INH IN AEPB: 1.0000 | INHALATION_SPRAY | Freq: Every day | RESPIRATORY_TRACT | 3 refills | Status: DC

## 2017-09-01 NOTE — Telephone Encounter (Signed)
Andrea Terrell would like a 90 day supply for BREO sent in to [facility].

## 2017-09-01 NOTE — Telephone Encounter (Signed)
rx sent in 

## 2017-09-03 DIAGNOSIS — L719 Rosacea, unspecified: Secondary | ICD-10-CM | POA: Diagnosis not present

## 2017-09-04 DIAGNOSIS — R531 Weakness: Secondary | ICD-10-CM | POA: Diagnosis not present

## 2017-09-04 DIAGNOSIS — E785 Hyperlipidemia, unspecified: Secondary | ICD-10-CM | POA: Diagnosis not present

## 2017-09-04 DIAGNOSIS — L700 Acne vulgaris: Secondary | ICD-10-CM | POA: Diagnosis not present

## 2017-09-11 DIAGNOSIS — J4550 Severe persistent asthma, uncomplicated: Secondary | ICD-10-CM

## 2017-09-11 DIAGNOSIS — G4733 Obstructive sleep apnea (adult) (pediatric): Secondary | ICD-10-CM | POA: Diagnosis not present

## 2017-09-12 DIAGNOSIS — G4733 Obstructive sleep apnea (adult) (pediatric): Secondary | ICD-10-CM | POA: Diagnosis not present

## 2017-09-14 ENCOUNTER — Ambulatory Visit (INDEPENDENT_AMBULATORY_CARE_PROVIDER_SITE_OTHER): Payer: BLUE CROSS/BLUE SHIELD | Admitting: *Deleted

## 2017-09-14 DIAGNOSIS — J4550 Severe persistent asthma, uncomplicated: Secondary | ICD-10-CM | POA: Diagnosis not present

## 2017-09-23 DIAGNOSIS — M9902 Segmental and somatic dysfunction of thoracic region: Secondary | ICD-10-CM | POA: Diagnosis not present

## 2017-09-23 DIAGNOSIS — M9901 Segmental and somatic dysfunction of cervical region: Secondary | ICD-10-CM | POA: Diagnosis not present

## 2017-09-23 DIAGNOSIS — M542 Cervicalgia: Secondary | ICD-10-CM | POA: Diagnosis not present

## 2017-10-06 ENCOUNTER — Other Ambulatory Visit: Payer: Self-pay | Admitting: *Deleted

## 2017-10-06 ENCOUNTER — Other Ambulatory Visit: Payer: Self-pay | Admitting: Allergy and Immunology

## 2017-10-06 DIAGNOSIS — J4550 Severe persistent asthma, uncomplicated: Secondary | ICD-10-CM

## 2017-10-06 MED ORDER — MONTELUKAST SODIUM 10 MG PO TABS: ORAL_TABLET | ORAL | 1 refills | Status: DC

## 2017-10-08 DIAGNOSIS — L700 Acne vulgaris: Secondary | ICD-10-CM | POA: Diagnosis not present

## 2017-10-09 DIAGNOSIS — E785 Hyperlipidemia, unspecified: Secondary | ICD-10-CM | POA: Diagnosis not present

## 2017-10-09 DIAGNOSIS — R531 Weakness: Secondary | ICD-10-CM | POA: Diagnosis not present

## 2017-10-09 DIAGNOSIS — L700 Acne vulgaris: Secondary | ICD-10-CM | POA: Diagnosis not present

## 2017-10-12 ENCOUNTER — Ambulatory Visit: Payer: Self-pay

## 2017-10-16 DIAGNOSIS — M542 Cervicalgia: Secondary | ICD-10-CM | POA: Diagnosis not present

## 2017-10-16 DIAGNOSIS — M9901 Segmental and somatic dysfunction of cervical region: Secondary | ICD-10-CM | POA: Diagnosis not present

## 2017-10-16 DIAGNOSIS — M9902 Segmental and somatic dysfunction of thoracic region: Secondary | ICD-10-CM | POA: Diagnosis not present

## 2017-10-19 ENCOUNTER — Ambulatory Visit: Payer: Self-pay

## 2017-10-27 ENCOUNTER — Ambulatory Visit (INDEPENDENT_AMBULATORY_CARE_PROVIDER_SITE_OTHER): Payer: BLUE CROSS/BLUE SHIELD | Admitting: *Deleted

## 2017-10-27 DIAGNOSIS — J4550 Severe persistent asthma, uncomplicated: Secondary | ICD-10-CM

## 2017-10-28 DIAGNOSIS — J4550 Severe persistent asthma, uncomplicated: Secondary | ICD-10-CM | POA: Diagnosis not present

## 2017-10-30 DIAGNOSIS — M542 Cervicalgia: Secondary | ICD-10-CM | POA: Diagnosis not present

## 2017-10-30 DIAGNOSIS — M9901 Segmental and somatic dysfunction of cervical region: Secondary | ICD-10-CM | POA: Diagnosis not present

## 2017-10-30 DIAGNOSIS — M9902 Segmental and somatic dysfunction of thoracic region: Secondary | ICD-10-CM | POA: Diagnosis not present

## 2017-11-11 DIAGNOSIS — H5200 Hypermetropia, unspecified eye: Secondary | ICD-10-CM | POA: Diagnosis not present

## 2017-11-19 DIAGNOSIS — L700 Acne vulgaris: Secondary | ICD-10-CM | POA: Diagnosis not present

## 2017-11-25 DIAGNOSIS — G4733 Obstructive sleep apnea (adult) (pediatric): Secondary | ICD-10-CM | POA: Diagnosis not present

## 2017-11-27 DIAGNOSIS — J4550 Severe persistent asthma, uncomplicated: Secondary | ICD-10-CM | POA: Diagnosis not present

## 2017-11-30 ENCOUNTER — Ambulatory Visit (INDEPENDENT_AMBULATORY_CARE_PROVIDER_SITE_OTHER): Payer: BLUE CROSS/BLUE SHIELD | Admitting: *Deleted

## 2017-11-30 DIAGNOSIS — J4550 Severe persistent asthma, uncomplicated: Secondary | ICD-10-CM | POA: Diagnosis not present

## 2017-12-11 DIAGNOSIS — M9902 Segmental and somatic dysfunction of thoracic region: Secondary | ICD-10-CM | POA: Diagnosis not present

## 2017-12-11 DIAGNOSIS — M542 Cervicalgia: Secondary | ICD-10-CM | POA: Diagnosis not present

## 2017-12-11 DIAGNOSIS — M9901 Segmental and somatic dysfunction of cervical region: Secondary | ICD-10-CM | POA: Diagnosis not present

## 2017-12-28 ENCOUNTER — Ambulatory Visit (INDEPENDENT_AMBULATORY_CARE_PROVIDER_SITE_OTHER): Payer: BLUE CROSS/BLUE SHIELD | Admitting: *Deleted

## 2017-12-28 DIAGNOSIS — L501 Idiopathic urticaria: Secondary | ICD-10-CM | POA: Diagnosis not present

## 2017-12-28 DIAGNOSIS — J4550 Severe persistent asthma, uncomplicated: Secondary | ICD-10-CM

## 2017-12-29 DIAGNOSIS — L501 Idiopathic urticaria: Secondary | ICD-10-CM

## 2018-01-07 DIAGNOSIS — L300 Nummular dermatitis: Secondary | ICD-10-CM | POA: Diagnosis not present

## 2018-01-07 DIAGNOSIS — L700 Acne vulgaris: Secondary | ICD-10-CM | POA: Diagnosis not present

## 2018-01-24 DIAGNOSIS — J4540 Moderate persistent asthma, uncomplicated: Secondary | ICD-10-CM | POA: Diagnosis not present

## 2018-01-25 ENCOUNTER — Ambulatory Visit (INDEPENDENT_AMBULATORY_CARE_PROVIDER_SITE_OTHER): Payer: BLUE CROSS/BLUE SHIELD | Admitting: *Deleted

## 2018-01-25 DIAGNOSIS — J4550 Severe persistent asthma, uncomplicated: Secondary | ICD-10-CM

## 2018-01-25 DIAGNOSIS — J4540 Moderate persistent asthma, uncomplicated: Secondary | ICD-10-CM | POA: Diagnosis not present

## 2018-02-03 DIAGNOSIS — G4733 Obstructive sleep apnea (adult) (pediatric): Secondary | ICD-10-CM | POA: Diagnosis not present

## 2018-02-15 DIAGNOSIS — M542 Cervicalgia: Secondary | ICD-10-CM | POA: Diagnosis not present

## 2018-02-15 DIAGNOSIS — L700 Acne vulgaris: Secondary | ICD-10-CM | POA: Diagnosis not present

## 2018-02-15 DIAGNOSIS — M9902 Segmental and somatic dysfunction of thoracic region: Secondary | ICD-10-CM | POA: Diagnosis not present

## 2018-02-15 DIAGNOSIS — M9901 Segmental and somatic dysfunction of cervical region: Secondary | ICD-10-CM | POA: Diagnosis not present

## 2018-02-22 ENCOUNTER — Ambulatory Visit: Payer: Self-pay | Admitting: *Deleted

## 2018-02-23 DIAGNOSIS — J4550 Severe persistent asthma, uncomplicated: Secondary | ICD-10-CM

## 2018-02-25 ENCOUNTER — Ambulatory Visit (INDEPENDENT_AMBULATORY_CARE_PROVIDER_SITE_OTHER): Payer: BLUE CROSS/BLUE SHIELD | Admitting: *Deleted

## 2018-02-25 DIAGNOSIS — J4550 Severe persistent asthma, uncomplicated: Secondary | ICD-10-CM

## 2018-03-24 DIAGNOSIS — H6691 Otitis media, unspecified, right ear: Secondary | ICD-10-CM | POA: Diagnosis not present

## 2018-03-24 DIAGNOSIS — H60391 Other infective otitis externa, right ear: Secondary | ICD-10-CM | POA: Diagnosis not present

## 2018-03-25 ENCOUNTER — Ambulatory Visit: Payer: BLUE CROSS/BLUE SHIELD

## 2018-03-26 DIAGNOSIS — G4733 Obstructive sleep apnea (adult) (pediatric): Secondary | ICD-10-CM | POA: Diagnosis not present

## 2018-04-14 DIAGNOSIS — J3089 Other allergic rhinitis: Secondary | ICD-10-CM | POA: Diagnosis not present

## 2018-04-14 DIAGNOSIS — J4550 Severe persistent asthma, uncomplicated: Secondary | ICD-10-CM | POA: Diagnosis not present

## 2018-04-15 ENCOUNTER — Ambulatory Visit: Payer: BLUE CROSS/BLUE SHIELD | Admitting: Allergy and Immunology

## 2018-04-15 ENCOUNTER — Ambulatory Visit: Payer: Self-pay | Admitting: Allergy and Immunology

## 2018-04-15 ENCOUNTER — Encounter: Payer: Self-pay | Admitting: Allergy and Immunology

## 2018-04-15 VITALS — BP 118/82 | HR 76 | Resp 16

## 2018-04-15 DIAGNOSIS — J3089 Other allergic rhinitis: Secondary | ICD-10-CM

## 2018-04-15 DIAGNOSIS — J4550 Severe persistent asthma, uncomplicated: Secondary | ICD-10-CM | POA: Diagnosis not present

## 2018-04-15 MED ORDER — MONTELUKAST SODIUM 10 MG PO TABS: ORAL_TABLET | ORAL | 11 refills | Status: DC

## 2018-04-15 MED ORDER — FLUTICASONE FUROATE-VILANTEROL 200-25 MCG/INH IN AEPB: 1.0000 | INHALATION_SPRAY | Freq: Every day | RESPIRATORY_TRACT | 11 refills | Status: DC

## 2018-04-15 NOTE — Patient Instructions (Signed)
  1. Continue BREO 200 one inhalation 1 times per day  2. Continue Montelukast 10 mg tablet 1 time per day  3.  Continue Xolair and AUVI-Q 0.3  4. If needed:   A. OTC Allegra  B. Proventil HFA or similar 2 inhalations every 4-6 hour  5. Return to clinic in 12 months or earlier if problem

## 2018-04-15 NOTE — Progress Notes (Signed)
Follow-up Note  Referring Provider: Paulina Fusi, MD Primary Provider: Paulina Fusi, MD Date of Office Visit: 04/15/2018  Subjective:   Andrea Terrell (DOB: 03/19/1965) is a 53 y.o. female [facility] in re-evaluation of the following:  HPI: Andrea Terrell returns to this clinic in reevaluation of her severe asthma and history of allergic rhinitis presently treated with omalizumab, Breo, montelukast.  I last saw her in this clinic on 17 August 2017.  Her current medical therapy has resulted in excellent control of her asthma as it has for the past several years.  Rarely does she use a short acting bronchodilator and rarely does she have any difficulty with exercise.  She has not required a systemic steroid or antibiotic for any type of respiratory tract issue although she did have an episode of otitis media in January 2020.  She did receive the flu vaccine this year.  She continues on CPAP for her sleep apnea followed by Dr. Craige Cotta.  Allergies as of 04/15/2018      Reactions   Ciprofloxacin Other (See Comments)   Joint pain   Codeine Other (See Comments)   NERVOUSNESS   Penicillins Rash   Sulfa Antibiotics Rash      Medication List      albuterol 108 (90 Base) MCG/ACT inhaler Commonly known as:  PROAIR HFA Inhale 2 puffs into the lungs every 4 (four) hours as needed for wheezing or shortness of breath.   ALLEGRA ALLERGY 180 MG tablet Generic drug:  fexofenadine Take 180 mg by mouth daily as needed for allergies or rhinitis.   finasteride 5 MG tablet Commonly known as:  PROSCAR   fluticasone furoate-vilanterol 200-25 MCG/INH Aepb Commonly known as:  BREO ELLIPTA Inhale 1 puff into the lungs daily.   levothyroxine 75 MCG tablet Commonly known as:  SYNTHROID, LEVOTHROID   montelukast 10 MG tablet Commonly known as:  SINGULAIR TAKE 1 TABLET BY MOUTH ONCE DAILY. **NEEDS APPOINTMENT**   sterile water (preservative  free) injection Inject 10 mLs as directed as directed.   venlafaxine XR 75 MG 24 hr capsule Commonly known as:  EFFEXOR-XR   Vitamin D (Ergocalciferol) 1.25 MG (50000 UT) Caps capsule Commonly known as:  DRISDOL   XOLAIR 150 MG injection Generic drug:  omalizumab INJECT 300 MG SUBCUTANEOUSLY EVERY 4 WEEKS.       Past Medical History:  Diagnosis Date  . Asthma   . Hypothyroid   . OSA (obstructive sleep apnea) 02/08/2016    Past Surgical History:  Procedure Laterality Date  . ABDOMINAL HERNIA REPAIR     age 63  . ADENOIDECTOMY    . CERVICAL ABLATION  2006  . FINGER SURGERY     age 89  . TONSILLECTOMY    . TUBAL LIGATION  1996    Review of systems negative except as noted in HPI / PMHx or noted below:  Review of Systems  Constitutional: Negative.   HENT: Negative.   Eyes: Negative.   Respiratory: Negative.   Cardiovascular: Negative.   Gastrointestinal: Negative.   Genitourinary: Negative.   Musculoskeletal: Negative.   Skin: Negative.   Neurological: Negative.   Endo/Heme/Allergies: Negative.   Psychiatric/Behavioral: Negative.      Objective:   Vitals:   04/15/18 1421  BP: 118/82  Pulse: 76  Resp: 16  SpO2: 97%          Physical Exam Constitutional:      Appearance: She is  not diaphoretic.  HENT:     Head: Normocephalic.     Right Ear: Tympanic membrane, ear canal and external ear normal.     Left Ear: Tympanic membrane, ear canal and external ear normal.     Nose: Nose normal. No mucosal edema or rhinorrhea.     Mouth/Throat:     Pharynx: Uvula midline. No oropharyngeal exudate.  Eyes:     Conjunctiva/sclera: Conjunctivae normal.  Neck:     Thyroid: No thyromegaly.     Trachea: Trachea normal. No tracheal tenderness or tracheal deviation.  Cardiovascular:     Rate and Rhythm: Normal rate and regular rhythm.     Heart sounds: Normal heart sounds, S1 normal and S2 normal. No murmur.  Pulmonary:     Effort: No respiratory distress.      Breath sounds: Normal breath sounds. No stridor. No wheezing or rales.  Lymphadenopathy:     Head:     Right side of head: No tonsillar adenopathy.     Left side of head: No tonsillar adenopathy.     Cervical: No cervical adenopathy.  Skin:    Findings: No erythema or rash.     Nails: There is no clubbing.   Neurological:     Mental Status: She is alert.     Diagnostics:    Spirometry was performed and demonstrated an FEV1 of 2.13 at 75 % of predicted.  Assessment and Plan:   1. Asthma, severe persistent, well-controlled   2. Other allergic rhinitis     1. Continue BREO 200 one inhalation 1 times per day  2. Continue Montelukast 10 mg tablet 1 time per day  3.  Continue Xolair and AUVI-Q 0.3  4. If needed:   A. OTC Allegra  B. Proventil HFA or similar 2 inhalations every 4-6 hour  5. Return to clinic in 12 months or earlier if problem  Andrea Terrell appears to be doing quite well on her current plan which includes omalizumab and anti-inflammatory agents for her airway and she will continue to utilize this plan at this point.  She has a very good understanding of her disease state and how her medications work and appropriate dosing of her medications.  I will see her back in this clinic in 12 months or earlier if there is a problem.  [name], MD Allergy / Immunology [facility]

## 2018-04-19 ENCOUNTER — Encounter: Payer: Self-pay | Admitting: Allergy and Immunology

## 2018-04-23 DIAGNOSIS — M9901 Segmental and somatic dysfunction of cervical region: Secondary | ICD-10-CM | POA: Diagnosis not present

## 2018-04-23 DIAGNOSIS — M9902 Segmental and somatic dysfunction of thoracic region: Secondary | ICD-10-CM | POA: Diagnosis not present

## 2018-04-23 DIAGNOSIS — M542 Cervicalgia: Secondary | ICD-10-CM | POA: Diagnosis not present

## 2018-05-06 DIAGNOSIS — G4733 Obstructive sleep apnea (adult) (pediatric): Secondary | ICD-10-CM | POA: Diagnosis not present

## 2018-05-13 ENCOUNTER — Ambulatory Visit: Payer: Self-pay

## 2018-05-14 DIAGNOSIS — J029 Acute pharyngitis, unspecified: Secondary | ICD-10-CM | POA: Diagnosis not present

## 2018-05-14 DIAGNOSIS — J019 Acute sinusitis, unspecified: Secondary | ICD-10-CM | POA: Diagnosis not present

## 2018-05-18 DIAGNOSIS — J4550 Severe persistent asthma, uncomplicated: Secondary | ICD-10-CM | POA: Diagnosis not present

## 2018-05-19 ENCOUNTER — Ambulatory Visit (INDEPENDENT_AMBULATORY_CARE_PROVIDER_SITE_OTHER): Payer: BLUE CROSS/BLUE SHIELD | Admitting: *Deleted

## 2018-05-19 DIAGNOSIS — J4550 Severe persistent asthma, uncomplicated: Secondary | ICD-10-CM

## 2018-05-25 DIAGNOSIS — G4733 Obstructive sleep apnea (adult) (pediatric): Secondary | ICD-10-CM | POA: Diagnosis not present

## 2018-06-16 ENCOUNTER — Ambulatory Visit: Payer: Self-pay

## 2018-06-18 DIAGNOSIS — L501 Idiopathic urticaria: Secondary | ICD-10-CM

## 2018-06-21 ENCOUNTER — Ambulatory Visit (INDEPENDENT_AMBULATORY_CARE_PROVIDER_SITE_OTHER): Payer: BLUE CROSS/BLUE SHIELD | Admitting: *Deleted

## 2018-06-21 DIAGNOSIS — L501 Idiopathic urticaria: Secondary | ICD-10-CM | POA: Diagnosis not present

## 2018-06-21 DIAGNOSIS — J4550 Severe persistent asthma, uncomplicated: Secondary | ICD-10-CM

## 2018-07-16 DIAGNOSIS — J4550 Severe persistent asthma, uncomplicated: Secondary | ICD-10-CM | POA: Diagnosis not present

## 2018-07-20 ENCOUNTER — Other Ambulatory Visit: Payer: Self-pay

## 2018-07-20 ENCOUNTER — Ambulatory Visit (INDEPENDENT_AMBULATORY_CARE_PROVIDER_SITE_OTHER): Payer: BLUE CROSS/BLUE SHIELD | Admitting: *Deleted

## 2018-07-20 DIAGNOSIS — J4550 Severe persistent asthma, uncomplicated: Secondary | ICD-10-CM | POA: Diagnosis not present

## 2018-08-13 DIAGNOSIS — M9902 Segmental and somatic dysfunction of thoracic region: Secondary | ICD-10-CM | POA: Diagnosis not present

## 2018-08-13 DIAGNOSIS — M9901 Segmental and somatic dysfunction of cervical region: Secondary | ICD-10-CM | POA: Diagnosis not present

## 2018-08-13 DIAGNOSIS — M542 Cervicalgia: Secondary | ICD-10-CM | POA: Diagnosis not present

## 2018-08-17 ENCOUNTER — Ambulatory Visit: Payer: Self-pay

## 2018-08-17 DIAGNOSIS — J4550 Severe persistent asthma, uncomplicated: Secondary | ICD-10-CM | POA: Diagnosis not present

## 2018-08-18 ENCOUNTER — Ambulatory Visit (INDEPENDENT_AMBULATORY_CARE_PROVIDER_SITE_OTHER): Payer: BC Managed Care – PPO | Admitting: *Deleted

## 2018-08-18 DIAGNOSIS — J4550 Severe persistent asthma, uncomplicated: Secondary | ICD-10-CM | POA: Diagnosis not present

## 2018-09-03 DIAGNOSIS — E039 Hypothyroidism, unspecified: Secondary | ICD-10-CM | POA: Diagnosis not present

## 2018-09-03 DIAGNOSIS — E785 Hyperlipidemia, unspecified: Secondary | ICD-10-CM | POA: Diagnosis not present

## 2018-09-03 DIAGNOSIS — F329 Major depressive disorder, single episode, unspecified: Secondary | ICD-10-CM | POA: Diagnosis not present

## 2018-09-03 DIAGNOSIS — Z0000 Encounter for general adult medical examination without abnormal findings: Secondary | ICD-10-CM | POA: Diagnosis not present

## 2018-09-15 ENCOUNTER — Ambulatory Visit: Payer: BC Managed Care – PPO

## 2018-09-15 DIAGNOSIS — J4550 Severe persistent asthma, uncomplicated: Secondary | ICD-10-CM | POA: Diagnosis not present

## 2018-09-16 ENCOUNTER — Ambulatory Visit (INDEPENDENT_AMBULATORY_CARE_PROVIDER_SITE_OTHER): Payer: BC Managed Care – PPO | Admitting: *Deleted

## 2018-09-16 DIAGNOSIS — J4550 Severe persistent asthma, uncomplicated: Secondary | ICD-10-CM | POA: Diagnosis not present

## 2018-10-08 ENCOUNTER — Other Ambulatory Visit: Payer: Self-pay | Admitting: *Deleted

## 2018-10-08 MED ORDER — ALBUTEROL SULFATE HFA 108 (90 BASE) MCG/ACT IN AERS: INHALATION_SPRAY | RESPIRATORY_TRACT | 1 refills | Status: DC

## 2018-10-13 DIAGNOSIS — L501 Idiopathic urticaria: Secondary | ICD-10-CM | POA: Diagnosis not present

## 2018-10-14 ENCOUNTER — Other Ambulatory Visit: Payer: Self-pay

## 2018-10-14 ENCOUNTER — Ambulatory Visit (INDEPENDENT_AMBULATORY_CARE_PROVIDER_SITE_OTHER): Payer: BC Managed Care – PPO | Admitting: *Deleted

## 2018-10-14 DIAGNOSIS — J4550 Severe persistent asthma, uncomplicated: Secondary | ICD-10-CM

## 2018-10-14 DIAGNOSIS — L501 Idiopathic urticaria: Secondary | ICD-10-CM | POA: Diagnosis not present

## 2018-11-11 ENCOUNTER — Ambulatory Visit: Payer: Self-pay

## 2018-11-17 DIAGNOSIS — J4550 Severe persistent asthma, uncomplicated: Secondary | ICD-10-CM

## 2018-11-18 ENCOUNTER — Ambulatory Visit (INDEPENDENT_AMBULATORY_CARE_PROVIDER_SITE_OTHER): Payer: BC Managed Care – PPO | Admitting: *Deleted

## 2018-11-18 DIAGNOSIS — J4550 Severe persistent asthma, uncomplicated: Secondary | ICD-10-CM

## 2018-12-15 DIAGNOSIS — J4540 Moderate persistent asthma, uncomplicated: Secondary | ICD-10-CM | POA: Diagnosis not present

## 2018-12-16 ENCOUNTER — Other Ambulatory Visit: Payer: Self-pay

## 2018-12-16 ENCOUNTER — Ambulatory Visit (INDEPENDENT_AMBULATORY_CARE_PROVIDER_SITE_OTHER): Payer: BC Managed Care – PPO

## 2018-12-16 DIAGNOSIS — J4540 Moderate persistent asthma, uncomplicated: Secondary | ICD-10-CM | POA: Diagnosis not present

## 2019-01-12 DIAGNOSIS — L501 Idiopathic urticaria: Secondary | ICD-10-CM | POA: Diagnosis not present

## 2019-01-13 ENCOUNTER — Ambulatory Visit (INDEPENDENT_AMBULATORY_CARE_PROVIDER_SITE_OTHER): Payer: BC Managed Care – PPO

## 2019-01-13 DIAGNOSIS — L501 Idiopathic urticaria: Secondary | ICD-10-CM

## 2019-02-09 DIAGNOSIS — J4540 Moderate persistent asthma, uncomplicated: Secondary | ICD-10-CM | POA: Diagnosis not present

## 2019-02-10 ENCOUNTER — Ambulatory Visit (INDEPENDENT_AMBULATORY_CARE_PROVIDER_SITE_OTHER): Payer: BC Managed Care – PPO

## 2019-02-10 ENCOUNTER — Other Ambulatory Visit: Payer: Self-pay

## 2019-02-10 DIAGNOSIS — J4540 Moderate persistent asthma, uncomplicated: Secondary | ICD-10-CM | POA: Diagnosis not present

## 2019-03-09 DIAGNOSIS — L501 Idiopathic urticaria: Secondary | ICD-10-CM | POA: Diagnosis not present

## 2019-03-10 ENCOUNTER — Ambulatory Visit (INDEPENDENT_AMBULATORY_CARE_PROVIDER_SITE_OTHER): Payer: BC Managed Care – PPO

## 2019-03-10 DIAGNOSIS — L501 Idiopathic urticaria: Secondary | ICD-10-CM | POA: Diagnosis not present

## 2019-03-10 DIAGNOSIS — J4540 Moderate persistent asthma, uncomplicated: Secondary | ICD-10-CM

## 2019-04-01 DIAGNOSIS — B379 Candidiasis, unspecified: Secondary | ICD-10-CM | POA: Diagnosis not present

## 2019-04-01 DIAGNOSIS — L03116 Cellulitis of left lower limb: Secondary | ICD-10-CM | POA: Diagnosis not present

## 2019-04-01 DIAGNOSIS — T3695XA Adverse effect of unspecified systemic antibiotic, initial encounter: Secondary | ICD-10-CM | POA: Diagnosis not present

## 2019-04-05 DIAGNOSIS — L03116 Cellulitis of left lower limb: Secondary | ICD-10-CM | POA: Diagnosis not present

## 2019-04-05 DIAGNOSIS — S81002A Unspecified open wound, left knee, initial encounter: Secondary | ICD-10-CM | POA: Diagnosis not present

## 2019-04-07 ENCOUNTER — Ambulatory Visit: Payer: Self-pay

## 2019-04-08 ENCOUNTER — Other Ambulatory Visit: Payer: Self-pay | Admitting: *Deleted

## 2019-04-08 ENCOUNTER — Telehealth: Payer: Self-pay | Admitting: Allergy and Immunology

## 2019-04-08 MED ORDER — PREDNISONE 10 MG PO TABS: ORAL_TABLET | ORAL | 0 refills | Status: DC

## 2019-04-08 NOTE — Telephone Encounter (Signed)
Andrea Terrell called in and asked if Dr. Lucie Leather could something in for her.  I informed her Dr. Lucie Leather was out of the office until Monday.  Andrea Terrell states she is wheezing and "coughing up stuff" and she believes she has bronchitis and wants to know if she can get prednisone or anything else.  Please advise.

## 2019-04-08 NOTE — Telephone Encounter (Signed)
Andrea Terrell informed that she needs to make an appt and rx sent to [facility].

## 2019-04-08 NOTE — Telephone Encounter (Signed)
Looks like she had an appointment yesterday and canceled.   Please make sure she schedules a follow-up appointment as she is soon due for her yearly visit with Dr. Lucie Leather.   With her asthma history would treat for an exacerbation with 5 day course of prednisone, 20mg  twice a day for 3 days, 10mg  twice a day x 1 day, 10mg  x1 day.

## 2019-04-12 ENCOUNTER — Other Ambulatory Visit: Payer: Self-pay | Admitting: Allergy and Immunology

## 2019-04-12 NOTE — Telephone Encounter (Signed)
Courtesy refill sent needs to make an visit

## 2019-04-27 DIAGNOSIS — J4550 Severe persistent asthma, uncomplicated: Secondary | ICD-10-CM | POA: Diagnosis not present

## 2019-04-27 DIAGNOSIS — J3089 Other allergic rhinitis: Secondary | ICD-10-CM | POA: Diagnosis not present

## 2019-04-28 ENCOUNTER — Encounter: Payer: Self-pay | Admitting: Allergy and Immunology

## 2019-04-28 ENCOUNTER — Other Ambulatory Visit: Payer: Self-pay

## 2019-04-28 ENCOUNTER — Ambulatory Visit: Payer: BC Managed Care – PPO | Admitting: Allergy and Immunology

## 2019-04-28 VITALS — BP 132/88 | HR 92 | Temp 98.3°F | Resp 14 | Ht 65.3 in | Wt 232.2 lb

## 2019-04-28 DIAGNOSIS — J3089 Other allergic rhinitis: Secondary | ICD-10-CM

## 2019-04-28 DIAGNOSIS — J4550 Severe persistent asthma, uncomplicated: Secondary | ICD-10-CM

## 2019-04-28 MED ORDER — BREO ELLIPTA 200-25 MCG/INH IN AEPB: 1.0000 | INHALATION_SPRAY | Freq: Every day | RESPIRATORY_TRACT | 11 refills | Status: DC

## 2019-04-28 MED ORDER — MONTELUKAST SODIUM 10 MG PO TABS: 10.0000 mg | ORAL_TABLET | Freq: Every day | ORAL | 11 refills | Status: DC

## 2019-04-28 NOTE — Patient Instructions (Addendum)
  1. Continue BREO 200 one inhalation 1 times per day  2. Continue Montelukast 10 mg tablet 1 time per day  3.  Continue Xolair and AUVI-Q 0.3  4. For this recent episode:   A. Add sample Qvar Redihaler 80 - 2 inhalations 2 times per day  B. Add OTC Nasacort - 1 spray each nostril 1-2 times per day  5. If needed:   A. OTC Allegra 180 - 1 tablet 1 time per day  B. Proventil HFA or similar 2 inhalations every 4-6 hour  C. OTC Pataday - 1 drop each eye 1 time per day  6. Return to clinic in 12 months or earlier if problem.

## 2019-04-28 NOTE — Progress Notes (Signed)
[place] - [place] - [place] - [place] - [place]   Follow-up Note  Referring Provider: Paulina Fusi, MD Primary Provider: Paulina Fusi, MD Date of Office Visit: 04/28/2019  Subjective:   Andrea Terrell (DOB: 03/03/1965) is a 54 y.o. female who [facility] 04/28/2019 in re-evaluation of the following:  HPI: [name] returns to this clinic in evaluation of her severe asthma treated with omalizumab and history of allergic rhinitis.  Her last visit to this clinic was 15 April 2018.  She had a very good year regarding her asthma without the need for systemic steroid and very little issues with her upper airway and without the need for an antibiotic while consistently using omalizumab and Breo and montelukast.  Unfortunately her situation changed in February 2021.  At that point time she started to develop some more issues with wheezing and coughing and had to use her short acting bronchodilator commonly and this was in conjunction with some itchy eyes and runny nose and some throat clearing.  She was administered a course of systemic steroids 3 weeks ago which did help her symptoms but she is not completely back to normal.  There is no associated fever or anosmia or ugly nasal discharge or chest pain or sputum production and there is no obvious provoking factor that is giving rise to this increased respiratory tract activity.  She has received her first Covid vaccine and she is scheduled for second vaccine on Monday.  Allergies as of 04/28/2019      Reactions   Ciprofloxacin Other (See Comments)   Joint pain   Codeine Other (See Comments)   NERVOUSNESS   Penicillins Rash   Sulfa Antibiotics Rash      Medication List    albuterol 108 (90 Base) MCG/ACT inhaler Commonly known as: ProAir HFA Inhale 2 puffs every 4-6 hours if needed for cough or wheeze.   Allegra Allergy 180 MG tablet Generic drug: fexofenadine Take 180 mg by  mouth daily as needed for allergies or rhinitis.   Auvi-Q 0.3 mg/0.3 mL Soaj injection Generic drug: EPINEPHrine Use as directed for life-threatening allergic reaction   finasteride 5 MG tablet Commonly known as: PROSCAR   fluticasone furoate-vilanterol 200-25 MCG/INH Aepb Commonly known as: Breo Ellipta Inhale 1 puff into the lungs daily. Rinse, gargle, and spit after use.   levothyroxine 75 MCG tablet Commonly known as: SYNTHROID   montelukast 10 MG tablet Commonly known as: SINGULAIR TAKE 1 TABLET BY MOUTH ONCE DAILY   venlafaxine XR 75 MG 24 hr capsule Commonly known as: EFFEXOR-XR   Vitamin D (Ergocalciferol) 1.25 MG (50000 UNIT) Caps capsule Commonly known as: DRISDOL   Xolair 150 MG injection Generic drug: omalizumab INJECT 300 MG SUBCUTANEOUSLY EVERY 4 WEEKS.       Past Medical History:  Diagnosis Date  . Asthma   . Hypothyroid   . OSA (obstructive sleep apnea) 02/08/2016    Past Surgical History:  Procedure Laterality Date  . ABDOMINAL HERNIA REPAIR     age 80  . ADENOIDECTOMY    . CERVICAL ABLATION  2006  . FINGER SURGERY     age 42  . TONSILLECTOMY    . TUBAL LIGATION  1996    Review of systems negative except as noted in HPI / PMHx or noted below:  Review of Systems  Constitutional: Negative.   HENT: Negative.   Eyes: Negative.   Respiratory: Negative.   Cardiovascular: Negative.  Gastrointestinal: Negative.   Genitourinary: Negative.   Musculoskeletal: Negative.   Skin: Negative.   Neurological: Negative.   Endo/Heme/Allergies: Negative.   Psychiatric/Behavioral: Negative.      Objective:   Vitals:   04/28/19 1633 04/28/19 1634  BP: 132/88   Pulse: 92   Resp: 14   Temp: 98.3 F (36.8 C)   SpO2: 95% 94%   Height: 5' 5.3" (165.9 cm)  Weight: 232 lb 3.2 oz (105.3 kg)   Physical Exam Constitutional:      Appearance: She is not diaphoretic.  HENT:     Head: Normocephalic.     Right Ear: Tympanic membrane, ear canal and  external ear normal.     Left Ear: Tympanic membrane, ear canal and external ear normal.     Nose: Nose normal. No mucosal edema or rhinorrhea.     Mouth/Throat:     Pharynx: Uvula midline. No oropharyngeal exudate.  Eyes:     Conjunctiva/sclera: Conjunctivae normal.  Neck:     Thyroid: No thyromegaly.     Trachea: Trachea normal. No tracheal tenderness or tracheal deviation.  Cardiovascular:     Rate and Rhythm: Normal rate and regular rhythm.     Heart sounds: Normal heart sounds, S1 normal and S2 normal. No murmur.  Pulmonary:     Effort: No respiratory distress.     Breath sounds: Normal breath sounds. No stridor. No wheezing or rales.  Lymphadenopathy:     Head:     Right side of head: No tonsillar adenopathy.     Left side of head: No tonsillar adenopathy.     Cervical: No cervical adenopathy.  Skin:    Findings: No erythema or rash.     Nails: There is no clubbing.  Neurological:     Mental Status: She is alert.      Diagnostics:    Spirometry was performed and demonstrated an FEV1 of 1.99 at 70 % of predicted.  The patient had an Asthma Control Test with the following results: ACT Total Score: 11.    Assessment and Plan:   1. Not well controlled severe persistent asthma   2. Other allergic rhinitis     1. Continue BREO 200 one inhalation 1 times per day  2. Continue Montelukast 10 mg tablet 1 time per day  3.  Continue Xolair and AUVI-Q 0.3  4. For this recent episode:   A. Add sample Qvar Redihaler 80 - 2 inhalations 2 times per day  B. Add OTC Nasacort - 1 spray each nostril 1-2 times per day  5. If needed:   A. OTC Allegra 180 - 1 tablet 1 time per day  B. Proventil HFA or similar 2 inhalations every 4-6 hour  C. OTC Pataday - 1 drop each eye 1 time per day  6. Return to clinic in 12 months or earlier if problem.  Blessin appears to have some inflammation of her airway.  I am going to hold off on giving her any systemic steroids as she will be  receiving the Covid vaccine soon and I would like for her immune system to maximally respond to this antigen exposure and if we give her systemic steroids I think that that response will be somewhat less than optimal.  We will try her on high-dose inhaled steroids over the course of the next week or so and see what happens regarding her response to this approach and she will continue on a large collection of anti-inflammatory agents for airway including  the use of omalizumab which has been working quite well over the course of the past year until her most recent flare.  Assuming she does well I will see her back in this clinic in 1 year or earlier if there is a problem.  Allena Katz, MD Allergy / Immunology [place]

## 2019-05-02 ENCOUNTER — Encounter: Payer: Self-pay | Admitting: Allergy and Immunology

## 2019-05-24 DIAGNOSIS — J4550 Severe persistent asthma, uncomplicated: Secondary | ICD-10-CM | POA: Diagnosis not present

## 2019-05-25 ENCOUNTER — Ambulatory Visit (INDEPENDENT_AMBULATORY_CARE_PROVIDER_SITE_OTHER): Payer: BC Managed Care – PPO | Admitting: *Deleted

## 2019-05-25 ENCOUNTER — Other Ambulatory Visit: Payer: Self-pay

## 2019-05-25 DIAGNOSIS — J4550 Severe persistent asthma, uncomplicated: Secondary | ICD-10-CM

## 2019-06-22 ENCOUNTER — Ambulatory Visit: Payer: BC Managed Care – PPO

## 2019-06-28 DIAGNOSIS — J4550 Severe persistent asthma, uncomplicated: Secondary | ICD-10-CM

## 2019-06-29 ENCOUNTER — Other Ambulatory Visit: Payer: Self-pay

## 2019-06-29 ENCOUNTER — Ambulatory Visit (INDEPENDENT_AMBULATORY_CARE_PROVIDER_SITE_OTHER): Payer: BLUE CROSS/BLUE SHIELD | Admitting: *Deleted

## 2019-06-29 DIAGNOSIS — J4550 Severe persistent asthma, uncomplicated: Secondary | ICD-10-CM | POA: Diagnosis not present

## 2019-07-27 ENCOUNTER — Ambulatory Visit: Payer: Self-pay

## 2019-07-27 DIAGNOSIS — J4550 Severe persistent asthma, uncomplicated: Secondary | ICD-10-CM | POA: Diagnosis not present

## 2019-07-28 ENCOUNTER — Other Ambulatory Visit: Payer: Self-pay

## 2019-07-28 ENCOUNTER — Ambulatory Visit (INDEPENDENT_AMBULATORY_CARE_PROVIDER_SITE_OTHER): Payer: BLUE CROSS/BLUE SHIELD | Admitting: *Deleted

## 2019-07-28 DIAGNOSIS — J4550 Severe persistent asthma, uncomplicated: Secondary | ICD-10-CM | POA: Diagnosis not present

## 2019-08-25 ENCOUNTER — Ambulatory Visit: Payer: BLUE CROSS/BLUE SHIELD

## 2019-08-30 DIAGNOSIS — J4550 Severe persistent asthma, uncomplicated: Secondary | ICD-10-CM | POA: Diagnosis not present

## 2019-08-31 ENCOUNTER — Other Ambulatory Visit: Payer: Self-pay

## 2019-08-31 ENCOUNTER — Ambulatory Visit (INDEPENDENT_AMBULATORY_CARE_PROVIDER_SITE_OTHER): Payer: BLUE CROSS/BLUE SHIELD | Admitting: *Deleted

## 2019-08-31 DIAGNOSIS — J4550 Severe persistent asthma, uncomplicated: Secondary | ICD-10-CM

## 2019-09-09 DIAGNOSIS — Z0000 Encounter for general adult medical examination without abnormal findings: Secondary | ICD-10-CM | POA: Diagnosis not present

## 2019-09-09 DIAGNOSIS — F329 Major depressive disorder, single episode, unspecified: Secondary | ICD-10-CM | POA: Diagnosis not present

## 2019-09-09 DIAGNOSIS — E039 Hypothyroidism, unspecified: Secondary | ICD-10-CM | POA: Diagnosis not present

## 2019-09-09 DIAGNOSIS — E785 Hyperlipidemia, unspecified: Secondary | ICD-10-CM | POA: Diagnosis not present

## 2019-09-28 ENCOUNTER — Ambulatory Visit: Payer: BLUE CROSS/BLUE SHIELD

## 2019-10-03 DIAGNOSIS — J4550 Severe persistent asthma, uncomplicated: Secondary | ICD-10-CM

## 2019-10-04 ENCOUNTER — Ambulatory Visit (INDEPENDENT_AMBULATORY_CARE_PROVIDER_SITE_OTHER): Payer: BLUE CROSS/BLUE SHIELD | Admitting: *Deleted

## 2019-10-04 ENCOUNTER — Other Ambulatory Visit: Payer: Self-pay

## 2019-10-04 DIAGNOSIS — J4550 Severe persistent asthma, uncomplicated: Secondary | ICD-10-CM | POA: Diagnosis not present

## 2019-11-01 ENCOUNTER — Ambulatory Visit: Payer: BLUE CROSS/BLUE SHIELD

## 2019-11-01 DIAGNOSIS — J4550 Severe persistent asthma, uncomplicated: Secondary | ICD-10-CM

## 2019-11-02 ENCOUNTER — Other Ambulatory Visit: Payer: Self-pay

## 2019-11-02 ENCOUNTER — Ambulatory Visit (INDEPENDENT_AMBULATORY_CARE_PROVIDER_SITE_OTHER): Payer: BLUE CROSS/BLUE SHIELD | Admitting: *Deleted

## 2019-11-02 DIAGNOSIS — J4550 Severe persistent asthma, uncomplicated: Secondary | ICD-10-CM

## 2019-11-11 DIAGNOSIS — F5081 Binge eating disorder: Secondary | ICD-10-CM | POA: Diagnosis not present

## 2019-11-11 DIAGNOSIS — F411 Generalized anxiety disorder: Secondary | ICD-10-CM | POA: Diagnosis not present

## 2019-11-11 DIAGNOSIS — F332 Major depressive disorder, recurrent severe without psychotic features: Secondary | ICD-10-CM | POA: Diagnosis not present

## 2019-11-28 DIAGNOSIS — J4550 Severe persistent asthma, uncomplicated: Secondary | ICD-10-CM | POA: Diagnosis not present

## 2019-11-29 ENCOUNTER — Ambulatory Visit (INDEPENDENT_AMBULATORY_CARE_PROVIDER_SITE_OTHER): Payer: BLUE CROSS/BLUE SHIELD

## 2019-11-29 DIAGNOSIS — J4550 Severe persistent asthma, uncomplicated: Secondary | ICD-10-CM | POA: Diagnosis not present

## 2019-11-30 ENCOUNTER — Ambulatory Visit: Payer: BLUE CROSS/BLUE SHIELD

## 2019-12-09 DIAGNOSIS — F411 Generalized anxiety disorder: Secondary | ICD-10-CM | POA: Diagnosis not present

## 2019-12-09 DIAGNOSIS — F5081 Binge eating disorder: Secondary | ICD-10-CM | POA: Diagnosis not present

## 2019-12-09 DIAGNOSIS — F332 Major depressive disorder, recurrent severe without psychotic features: Secondary | ICD-10-CM | POA: Diagnosis not present

## 2019-12-16 DIAGNOSIS — N951 Menopausal and female climacteric states: Secondary | ICD-10-CM | POA: Diagnosis not present

## 2019-12-16 DIAGNOSIS — Z124 Encounter for screening for malignant neoplasm of cervix: Secondary | ICD-10-CM | POA: Diagnosis not present

## 2019-12-16 DIAGNOSIS — Z1231 Encounter for screening mammogram for malignant neoplasm of breast: Secondary | ICD-10-CM | POA: Diagnosis not present

## 2019-12-16 DIAGNOSIS — Z01419 Encounter for gynecological examination (general) (routine) without abnormal findings: Secondary | ICD-10-CM | POA: Diagnosis not present

## 2019-12-26 DIAGNOSIS — J4550 Severe persistent asthma, uncomplicated: Secondary | ICD-10-CM | POA: Diagnosis not present

## 2019-12-27 ENCOUNTER — Other Ambulatory Visit: Payer: Self-pay

## 2019-12-27 ENCOUNTER — Ambulatory Visit (INDEPENDENT_AMBULATORY_CARE_PROVIDER_SITE_OTHER): Payer: BLUE CROSS/BLUE SHIELD

## 2019-12-27 DIAGNOSIS — J4550 Severe persistent asthma, uncomplicated: Secondary | ICD-10-CM

## 2020-01-13 DIAGNOSIS — F332 Major depressive disorder, recurrent severe without psychotic features: Secondary | ICD-10-CM | POA: Diagnosis not present

## 2020-01-13 DIAGNOSIS — F411 Generalized anxiety disorder: Secondary | ICD-10-CM | POA: Diagnosis not present

## 2020-01-13 DIAGNOSIS — F5081 Binge eating disorder: Secondary | ICD-10-CM | POA: Diagnosis not present

## 2020-01-18 DIAGNOSIS — D225 Melanocytic nevi of trunk: Secondary | ICD-10-CM | POA: Diagnosis not present

## 2020-01-18 DIAGNOSIS — D2239 Melanocytic nevi of other parts of face: Secondary | ICD-10-CM | POA: Diagnosis not present

## 2020-01-18 DIAGNOSIS — D1801 Hemangioma of skin and subcutaneous tissue: Secondary | ICD-10-CM | POA: Diagnosis not present

## 2020-01-18 DIAGNOSIS — L578 Other skin changes due to chronic exposure to nonionizing radiation: Secondary | ICD-10-CM | POA: Diagnosis not present

## 2020-01-24 ENCOUNTER — Ambulatory Visit: Payer: Self-pay

## 2020-02-01 DIAGNOSIS — J4550 Severe persistent asthma, uncomplicated: Secondary | ICD-10-CM

## 2020-02-02 ENCOUNTER — Ambulatory Visit (INDEPENDENT_AMBULATORY_CARE_PROVIDER_SITE_OTHER): Payer: BLUE CROSS/BLUE SHIELD | Admitting: *Deleted

## 2020-02-02 DIAGNOSIS — J4550 Severe persistent asthma, uncomplicated: Secondary | ICD-10-CM

## 2020-02-29 DIAGNOSIS — J4550 Severe persistent asthma, uncomplicated: Secondary | ICD-10-CM

## 2020-03-01 ENCOUNTER — Other Ambulatory Visit: Payer: Self-pay

## 2020-03-01 ENCOUNTER — Ambulatory Visit (INDEPENDENT_AMBULATORY_CARE_PROVIDER_SITE_OTHER): Payer: BLUE CROSS/BLUE SHIELD

## 2020-03-01 DIAGNOSIS — J4550 Severe persistent asthma, uncomplicated: Secondary | ICD-10-CM | POA: Diagnosis not present

## 2020-03-09 DIAGNOSIS — F5081 Binge eating disorder: Secondary | ICD-10-CM | POA: Diagnosis not present

## 2020-03-09 DIAGNOSIS — F332 Major depressive disorder, recurrent severe without psychotic features: Secondary | ICD-10-CM | POA: Diagnosis not present

## 2020-03-09 DIAGNOSIS — F411 Generalized anxiety disorder: Secondary | ICD-10-CM | POA: Diagnosis not present

## 2020-03-14 DIAGNOSIS — S0502XA Injury of conjunctiva and corneal abrasion without foreign body, left eye, initial encounter: Secondary | ICD-10-CM | POA: Diagnosis not present

## 2020-03-15 DIAGNOSIS — S0502XD Injury of conjunctiva and corneal abrasion without foreign body, left eye, subsequent encounter: Secondary | ICD-10-CM | POA: Diagnosis not present

## 2020-03-28 DIAGNOSIS — J4550 Severe persistent asthma, uncomplicated: Secondary | ICD-10-CM | POA: Diagnosis not present

## 2020-03-29 ENCOUNTER — Other Ambulatory Visit: Payer: Self-pay

## 2020-03-29 ENCOUNTER — Ambulatory Visit (INDEPENDENT_AMBULATORY_CARE_PROVIDER_SITE_OTHER): Payer: BLUE CROSS/BLUE SHIELD | Admitting: *Deleted

## 2020-03-29 DIAGNOSIS — J4550 Severe persistent asthma, uncomplicated: Secondary | ICD-10-CM | POA: Diagnosis not present

## 2020-03-29 MED ORDER — OMALIZUMAB 150 MG/ML ~~LOC~~ SOSY
300.0000 mg | PREFILLED_SYRINGE | SUBCUTANEOUS | Status: DC
  Administered 2020-03-29 – 2021-12-12 (×23): 300 mg via SUBCUTANEOUS

## 2020-04-25 DIAGNOSIS — J4550 Severe persistent asthma, uncomplicated: Secondary | ICD-10-CM

## 2020-04-26 ENCOUNTER — Other Ambulatory Visit: Payer: Self-pay

## 2020-04-26 ENCOUNTER — Ambulatory Visit (INDEPENDENT_AMBULATORY_CARE_PROVIDER_SITE_OTHER): Payer: BLUE CROSS/BLUE SHIELD | Admitting: *Deleted

## 2020-04-26 DIAGNOSIS — J4550 Severe persistent asthma, uncomplicated: Secondary | ICD-10-CM

## 2020-05-23 DIAGNOSIS — J4550 Severe persistent asthma, uncomplicated: Secondary | ICD-10-CM

## 2020-05-24 ENCOUNTER — Ambulatory Visit (INDEPENDENT_AMBULATORY_CARE_PROVIDER_SITE_OTHER): Payer: BLUE CROSS/BLUE SHIELD | Admitting: *Deleted

## 2020-05-24 ENCOUNTER — Other Ambulatory Visit: Payer: Self-pay

## 2020-05-24 DIAGNOSIS — J4550 Severe persistent asthma, uncomplicated: Secondary | ICD-10-CM | POA: Diagnosis not present

## 2020-06-05 DIAGNOSIS — F332 Major depressive disorder, recurrent severe without psychotic features: Secondary | ICD-10-CM | POA: Diagnosis not present

## 2020-06-05 DIAGNOSIS — H18832 Recurrent erosion of cornea, left eye: Secondary | ICD-10-CM | POA: Diagnosis not present

## 2020-06-05 DIAGNOSIS — F411 Generalized anxiety disorder: Secondary | ICD-10-CM | POA: Diagnosis not present

## 2020-06-05 DIAGNOSIS — H25813 Combined forms of age-related cataract, bilateral: Secondary | ICD-10-CM | POA: Diagnosis not present

## 2020-06-05 DIAGNOSIS — H524 Presbyopia: Secondary | ICD-10-CM | POA: Diagnosis not present

## 2020-06-05 DIAGNOSIS — F5081 Binge eating disorder: Secondary | ICD-10-CM | POA: Diagnosis not present

## 2020-06-05 DIAGNOSIS — H5203 Hypermetropia, bilateral: Secondary | ICD-10-CM | POA: Diagnosis not present

## 2020-06-05 DIAGNOSIS — H04123 Dry eye syndrome of bilateral lacrimal glands: Secondary | ICD-10-CM | POA: Diagnosis not present

## 2020-06-05 DIAGNOSIS — H35411 Lattice degeneration of retina, right eye: Secondary | ICD-10-CM | POA: Diagnosis not present

## 2020-06-20 DIAGNOSIS — J4550 Severe persistent asthma, uncomplicated: Secondary | ICD-10-CM | POA: Diagnosis not present

## 2020-06-21 ENCOUNTER — Ambulatory Visit (INDEPENDENT_AMBULATORY_CARE_PROVIDER_SITE_OTHER): Payer: BC Managed Care – PPO | Admitting: *Deleted

## 2020-06-21 ENCOUNTER — Other Ambulatory Visit: Payer: Self-pay

## 2020-06-21 DIAGNOSIS — J4550 Severe persistent asthma, uncomplicated: Secondary | ICD-10-CM | POA: Diagnosis not present

## 2020-07-18 DIAGNOSIS — J4550 Severe persistent asthma, uncomplicated: Secondary | ICD-10-CM | POA: Diagnosis not present

## 2020-07-19 ENCOUNTER — Encounter: Payer: Self-pay | Admitting: Allergy and Immunology

## 2020-07-19 ENCOUNTER — Other Ambulatory Visit: Payer: Self-pay

## 2020-07-19 ENCOUNTER — Ambulatory Visit: Payer: BC Managed Care – PPO

## 2020-07-19 ENCOUNTER — Ambulatory Visit: Payer: BC Managed Care – PPO | Admitting: Allergy and Immunology

## 2020-07-19 VITALS — BP 126/82 | HR 100 | Resp 16 | Ht 65.3 in | Wt 228.0 lb

## 2020-07-19 DIAGNOSIS — J3089 Other allergic rhinitis: Secondary | ICD-10-CM

## 2020-07-19 DIAGNOSIS — J4550 Severe persistent asthma, uncomplicated: Secondary | ICD-10-CM | POA: Diagnosis not present

## 2020-07-19 MED ORDER — ALBUTEROL SULFATE HFA 108 (90 BASE) MCG/ACT IN AERS: INHALATION_SPRAY | RESPIRATORY_TRACT | 1 refills | Status: DC

## 2020-07-19 MED ORDER — BREO ELLIPTA 200-25 MCG/INH IN AEPB: 1.0000 | INHALATION_SPRAY | Freq: Every day | RESPIRATORY_TRACT | 11 refills | Status: DC

## 2020-07-19 MED ORDER — MONTELUKAST SODIUM 10 MG PO TABS: 10.0000 mg | ORAL_TABLET | Freq: Every day | ORAL | 11 refills | Status: DC

## 2020-07-19 NOTE — Progress Notes (Signed)
[place] - [place] - [place] - [place] - [place]   Follow-up Note  Referring Provider: Paulina Fusi, MD Primary Provider: Paulina Fusi, MD Date of Office Visit: 07/19/2020  Subjective:   Andrea Terrell (DOB: [birth year]) is a 55 y.o. female who [facility] 07/19/2020 in re-evaluation of the following:  HPI: Andrea Terrell returns to this clinic in evaluation of severe asthma treated with omalizumab and history of allergic rhinitis.  Her last visit to this clinic was 28 April 2019.  Since her last visit she has really done well and has not had any significant issues involving her asthma and has not required a systemic steroid or an antibiotic for any type of airway issue and her requirement for short acting bronchodilator is about 1 time per month while she continues to use a combination inhaler and montelukast and omalizumab injections.  She has received 3 Moderna COVID vaccines.  Allergies as of 07/19/2020      Reactions   Ciprofloxacin Other (See Comments)   Joint pain   Codeine Other (See Comments)   NERVOUSNESS   Keflex [cephalexin] Hives   Penicillins Rash   Sulfa Antibiotics Rash      Medication List      albuterol 108 (90 Base) MCG/ACT inhaler Commonly known as: ProAir HFA Inhale 2 puffs every 4-6 hours if needed for cough or wheeze.   Allegra Allergy 180 MG tablet Generic drug: fexofenadine Take 180 mg by mouth daily as needed for allergies or rhinitis.   Auvi-Q 0.3 mg/0.3 mL Soaj injection Generic drug: EPINEPHrine Use as directed for life-threatening allergic reaction   Breo Ellipta 200-25 MCG/INH Aepb Generic drug: fluticasone furoate-vilanterol Inhale 1 puff into the lungs daily. Rinse, gargle, and spit after use.   finasteride 5 MG tablet Commonly known as: PROSCAR   levothyroxine 75 MCG tablet Commonly known as: SYNTHROID   montelukast 10 MG tablet Commonly known as: SINGULAIR Take 1 tablet (10 mg  total) by mouth daily.   venlafaxine XR 75 MG 24 hr capsule Commonly known as: EFFEXOR-XR   Vitamin D (Ergocalciferol) 1.25 MG (50000 UNIT) Caps capsule Commonly known as: DRISDOL   Xolair 150 MG injection Generic drug: omalizumab INJECT 300 MG SUBCUTANEOUSLY EVERY 4 WEEKS.       Past Medical History:  Diagnosis Date  . Asthma   . Hypothyroid   . OSA (obstructive sleep apnea) 02/08/2016    Past Surgical History:  Procedure Laterality Date  . ABDOMINAL HERNIA REPAIR     age 62  . ADENOIDECTOMY    . CERVICAL ABLATION  2006  . FINGER SURGERY     age 35  . TONSILLECTOMY    . TUBAL LIGATION  1996    Review of systems negative except as noted in HPI / PMHx or noted below:  Review of Systems  Constitutional: Negative.   HENT: Negative.   Eyes: Negative.   Respiratory: Negative.   Cardiovascular: Negative.   Gastrointestinal: Negative.   Genitourinary: Negative.   Musculoskeletal: Negative.   Skin: Negative.   Neurological: Negative.   Endo/Heme/Allergies: Negative.   Psychiatric/Behavioral: Negative.      Objective:   Vitals:   07/19/20 1540  BP: 126/82  Pulse: 100  Resp: 16  SpO2: 97%   Height: 5' 5.3" (165.9 cm)  Weight: 228 lb (103.4 kg)   Physical Exam Constitutional:      Appearance: She is not diaphoretic.  HENT:     Head: Normocephalic.  Right Ear: Tympanic membrane, ear canal and external ear normal.     Left Ear: Tympanic membrane, ear canal and external ear normal.     Nose: Nose normal. No mucosal edema or rhinorrhea.     Mouth/Throat:     Pharynx: Uvula midline. No oropharyngeal exudate.  Eyes:     Conjunctiva/sclera: Conjunctivae normal.  Neck:     Thyroid: No thyromegaly.     Trachea: Trachea normal. No tracheal tenderness or tracheal deviation.  Cardiovascular:     Rate and Rhythm: Normal rate and regular rhythm.     Heart sounds: Normal heart sounds, S1 normal and S2 normal. No murmur heard.   Pulmonary:     Effort: No  respiratory distress.     Breath sounds: Normal breath sounds. No stridor. No wheezing or rales.  Lymphadenopathy:     Head:     Right side of head: No tonsillar adenopathy.     Left side of head: No tonsillar adenopathy.     Cervical: No cervical adenopathy.  Skin:    Findings: No erythema or rash.     Nails: There is no clubbing.  Neurological:     Mental Status: She is alert.     Diagnostics:    Spirometry was performed and demonstrated an FEV1 of 2.18 at 80 % of predicted.  Assessment and Plan:   1. Asthma, severe persistent, well-controlled   2. Other allergic rhinitis     1. Continue BREO 200 one inhalation 1 time per day  2. Continue Montelukast 10 mg tablet 1 time per day  3.  Continue Xolair and AUVI-Q 0.3  4. If needed:   A. OTC Allegra 180 - 1 tablet 1 time per day  B. Proventil HFA or similar 2 inhalations every 4-6 hour  C. OTC Pataday - 1 drop each eye 1 time per day  5. Return to clinic in 12 months or earlier if problem.  Andrea Terrell is really doing very well on her current plan and she will continue to use omalizumab along with combination inhaler and a leukotriene modifier to control her multiorgan atopic disease.  I will see her back in this clinic in 1 year or earlier if there is a problem.  [name], MD Allergy / Immunology [facility]

## 2020-07-19 NOTE — Patient Instructions (Addendum)
  1. Continue BREO 200 one inhalation 1 time per day  2. Continue Montelukast 10 mg tablet 1 time per day  3.  Continue Xolair and AUVI-Q 0.3  4. If needed:   A. OTC Allegra 180 - 1 tablet 1 time per day  B. Proventil HFA or similar 2 inhalations every 4-6 hour  C. OTC Pataday - 1 drop each eye 1 time per day  5. Return to clinic in 12 months or earlier if problem.

## 2020-07-20 ENCOUNTER — Encounter: Payer: Self-pay | Admitting: Allergy and Immunology

## 2020-08-15 DIAGNOSIS — J4550 Severe persistent asthma, uncomplicated: Secondary | ICD-10-CM

## 2020-08-16 ENCOUNTER — Other Ambulatory Visit: Payer: Self-pay

## 2020-08-16 ENCOUNTER — Ambulatory Visit (INDEPENDENT_AMBULATORY_CARE_PROVIDER_SITE_OTHER): Payer: BC Managed Care – PPO | Admitting: *Deleted

## 2020-08-16 DIAGNOSIS — J4550 Severe persistent asthma, uncomplicated: Secondary | ICD-10-CM

## 2020-08-24 DIAGNOSIS — F5081 Binge eating disorder: Secondary | ICD-10-CM | POA: Diagnosis not present

## 2020-08-24 DIAGNOSIS — F332 Major depressive disorder, recurrent severe without psychotic features: Secondary | ICD-10-CM | POA: Diagnosis not present

## 2020-08-24 DIAGNOSIS — F411 Generalized anxiety disorder: Secondary | ICD-10-CM | POA: Diagnosis not present

## 2020-09-12 DIAGNOSIS — J4550 Severe persistent asthma, uncomplicated: Secondary | ICD-10-CM | POA: Diagnosis not present

## 2020-09-13 ENCOUNTER — Ambulatory Visit (INDEPENDENT_AMBULATORY_CARE_PROVIDER_SITE_OTHER): Payer: BC Managed Care – PPO

## 2020-09-13 ENCOUNTER — Other Ambulatory Visit: Payer: Self-pay

## 2020-09-13 DIAGNOSIS — J4550 Severe persistent asthma, uncomplicated: Secondary | ICD-10-CM

## 2020-09-14 DIAGNOSIS — Z0000 Encounter for general adult medical examination without abnormal findings: Secondary | ICD-10-CM | POA: Diagnosis not present

## 2020-09-21 DIAGNOSIS — B351 Tinea unguium: Secondary | ICD-10-CM | POA: Diagnosis not present

## 2020-09-26 ENCOUNTER — Other Ambulatory Visit: Payer: Self-pay | Admitting: *Deleted

## 2020-09-26 ENCOUNTER — Telehealth: Payer: Self-pay | Admitting: Allergy and Immunology

## 2020-09-26 MED ORDER — PREDNISONE 10 MG PO TABS: ORAL_TABLET | ORAL | 0 refills | Status: DC

## 2020-09-26 NOTE — Telephone Encounter (Signed)
Andrea Terrell said that she cannot think of anything that has changed or any new triggers.  She just noticed the tightness/shortness of breath over the last couple of weeks.  She said that she will continue using her medications daily as well as take the Prednisone and call us if she does notice a difference in her symptoms.   I will send the prednisone to [facility] on [address].

## 2020-09-26 NOTE — Telephone Encounter (Signed)
Please have her use prednisone 10 mg - 1 tablet 1 time per day for the next 10 days in addition to using her medications consistently.  Is there an obvious trigger for why she developed this issue?  New exposure?  Viral infection?

## 2020-09-26 NOTE — Telephone Encounter (Signed)
Patient started with breathing issues about two to three weeks ago.  She had tapered down on her BREO and montelukast to about three times per week since she last saw Dr. Lucie Leather on May 26th.  She restarted using the medications every day this past weekend to see if it would help with symptoms.  She is having chest tightness, shortness of breath, and some coughing.  She has been needing to use her albuterol inhaler a few times per day.  Patient wanted to see if there was anything she could be doing to help.  Please advise.

## 2020-09-26 NOTE — Telephone Encounter (Signed)
Patient called and said that she is having to go back and use her albuterol inhaler 2 to 3 times a day because she is coughing and chest is tied . Wanted to know if there is any thing she could use. [phone]

## 2020-10-10 DIAGNOSIS — J4550 Severe persistent asthma, uncomplicated: Secondary | ICD-10-CM

## 2020-10-11 ENCOUNTER — Ambulatory Visit (INDEPENDENT_AMBULATORY_CARE_PROVIDER_SITE_OTHER): Payer: BC Managed Care – PPO | Admitting: *Deleted

## 2020-10-11 ENCOUNTER — Other Ambulatory Visit: Payer: Self-pay

## 2020-10-11 DIAGNOSIS — J4550 Severe persistent asthma, uncomplicated: Secondary | ICD-10-CM | POA: Diagnosis not present

## 2020-10-17 ENCOUNTER — Ambulatory Visit: Payer: BC Managed Care – PPO | Admitting: Allergy and Immunology

## 2020-10-17 ENCOUNTER — Encounter: Payer: Self-pay | Admitting: Allergy and Immunology

## 2020-10-17 ENCOUNTER — Other Ambulatory Visit: Payer: Self-pay

## 2020-10-17 VITALS — BP 132/78 | HR 92

## 2020-10-17 DIAGNOSIS — J4550 Severe persistent asthma, uncomplicated: Secondary | ICD-10-CM | POA: Diagnosis not present

## 2020-10-17 DIAGNOSIS — J3089 Other allergic rhinitis: Secondary | ICD-10-CM

## 2020-10-17 DIAGNOSIS — T50905D Adverse effect of unspecified drugs, medicaments and biological substances, subsequent encounter: Secondary | ICD-10-CM

## 2020-10-17 MED ORDER — METHYLPREDNISOLONE ACETATE 80 MG/ML IJ SUSP
80.0000 mg | Freq: Once | INTRAMUSCULAR | Status: AC
  Administered 2020-10-17: 80 mg via INTRAMUSCULAR

## 2020-10-17 MED ORDER — SPIRIVA RESPIMAT 1.25 MCG/ACT IN AERS: 2.0000 | INHALATION_SPRAY | Freq: Every day | RESPIRATORY_TRACT | 5 refills | Status: DC

## 2020-10-17 NOTE — Patient Instructions (Addendum)
  1. Continue BREO 200 one inhalation 1 time per day  2. START Spiriva 1.25 Respimat - 2 inhalations 1 time per day  3.  Depo-Medrol 80 IM delivered in clinic today  4.  Discontinue atorvastatin  5. Continue Montelukast 10 mg tablet 1 time per day  6.  Continue Xolair and AUVI-Q 0.3  7. If needed:   A. OTC Allegra 180 - 1 tablet 1 time per day  B. Proventil HFA or similar 2 inhalations every 4-6 hour  C. OTC Pataday - 1 drop each eye 1 time per day  8. Contact clinic in 4 weeks with update  9. Return to clinic in 12 months or earlier if problem.  10. Obtain fall flu vaccine

## 2020-10-17 NOTE — Progress Notes (Signed)
[place] [facility] [place] [facility] [place] [facility] [place] [facility] [place] [facility]   Follow-up Note  Referring Provider: Paulina Fusi, MD Primary Provider: Paulina Fusi, MD Date of Office Visit: 10/17/2020  Subjective:   Andrea Terrell (DOB: Oct 18, 1965) is a 55 y.o. female who [facility] in re-evaluation of the following:  HPI: Andrea Terrell returns to this clinic in evaluation of asthma and allergic rhinitis.  Her last visit to this clinic was 19 Jul 2020.  She was doing quite well until about 1 month ago at which point in time she started to develop problems with her breathing.  She developed some shortness of breath and chest tightness and chest pressure and she felt as though something was sitting on her chest and migrating to her back and this occurred even during rest or during exercise.  She has been using her short acting bronchodilator quite frequently when she gets a minimal amount of response from using this agent.    She continues on Breo and omalizumab injections and montelukast.  She took 10 days of low-dose prednisone which did help the symptoms somewhat during that timeframe but then she quickly relapse after discontinuing her prednisone.  There has not really been a significant environmental change that can account for this issue. She has not had any significant upper airway symptoms associated with this event.  She has not had any significant reflux symptoms associated with this event.  She did start a new cholesterol medicine around the same time that this event developed.  Allergies as of 10/17/2020       Reactions   Ciprofloxacin Other (See Comments)   Joint pain   Codeine Other (See Comments)   NERVOUSNESS   Keflex [cephalexin] Hives   Penicillins Rash   Sulfa Antibiotics Rash        Medication List    albuterol 108 (90 Base) MCG/ACT inhaler Commonly known as: ProAir HFA Inhale 2 puffs every 4-6 hours if needed for cough or  wheeze.   atorvastatin 20 MG tablet Commonly known as: LIPITOR Take 20 mg by mouth daily.   Auvi-Q 0.3 mg/0.3 mL Soaj injection Generic drug: EPINEPHrine Use as directed for life-threatening allergic reaction   Breo Ellipta 200-25 MCG/INH Aepb Generic drug: fluticasone furoate-vilanterol Inhale 1 puff into the lungs daily. Rinse, gargle, and spit after use.   doxycycline 20 MG tablet Commonly known as: PERIOSTAT Take 20 mg by mouth 2 (two) times daily.   fexofenadine 180 MG tablet Commonly known as: ALLEGRA Take 180 mg by mouth daily as needed for allergies or rhinitis.   finasteride 5 MG tablet Commonly known as: PROSCAR   levothyroxine 75 MCG tablet Commonly known as: SYNTHROID   montelukast 10 MG tablet Commonly known as: SINGULAIR Take 1 tablet (10 mg total) by mouth daily.   NON FORMULARY CPAP nightly   predniSONE 10 MG tablet Commonly known as: DELTASONE Take one tablet once daily for 10 days   valACYclovir 1000 MG tablet Commonly known as: VALTREX Take 4,000 mg by mouth once.   venlafaxine XR 75 MG 24 hr capsule Commonly known as: EFFEXOR-XR   Vitamin D (Ergocalciferol) 1.25 MG (50000 UNIT) Caps capsule Commonly known as: DRISDOL   Vyvanse 30 MG capsule Generic drug: lisdexamfetamine Take 30 mg by mouth daily as needed.   Xolair 150 MG injection Generic drug: omalizumab INJECT 300 MG SUBCUTANEOUSLY EVERY 4 WEEKS.    Past Medical History:  Diagnosis Date   Asthma  Hypothyroid    OSA (obstructive sleep apnea) 02/08/2016    Past Surgical History:  Procedure Laterality Date   ABDOMINAL HERNIA REPAIR     age 36   ADENOIDECTOMY     CERVICAL ABLATION  2006   FINGER SURGERY     age 25   TONSILLECTOMY     TUBAL LIGATION  1996    Review of systems negative except as noted in HPI / PMHx or noted below:  Review of Systems  Constitutional: Negative.   HENT: Negative.    Eyes: Negative.   Respiratory: Negative.    Cardiovascular: Negative.    Gastrointestinal: Negative.   Genitourinary: Negative.   Musculoskeletal: Negative.   Skin: Negative.   Neurological: Negative.   Endo/Heme/Allergies: Negative.   Psychiatric/Behavioral: Negative.      Objective:   Vitals:   10/17/20 1345  BP: 132/78  Pulse: 92  SpO2: 98%          Physical Exam Constitutional:      Appearance: She is not diaphoretic.  HENT:     Head: Normocephalic.     Right Ear: Tympanic membrane, ear canal and external ear normal.     Left Ear: Tympanic membrane, ear canal and external ear normal.     Nose: Nose normal. No mucosal edema or rhinorrhea.     Mouth/Throat:     Pharynx: Uvula midline. No oropharyngeal exudate.  Eyes:     Conjunctiva/sclera: Conjunctivae normal.  Neck:     Thyroid: No thyromegaly.     Trachea: Trachea normal. No tracheal tenderness or tracheal deviation.  Cardiovascular:     Rate and Rhythm: Normal rate and regular rhythm.     Heart sounds: Normal heart sounds, S1 normal and S2 normal. No murmur heard. Pulmonary:     Effort: No respiratory distress.     Breath sounds: Normal breath sounds. No stridor. No wheezing or rales.  Lymphadenopathy:     Head:     Right side of head: No tonsillar adenopathy.     Left side of head: No tonsillar adenopathy.     Cervical: No cervical adenopathy.  Skin:    Findings: No erythema or rash.     Nails: There is no clubbing.  Neurological:     Mental Status: She is alert.    Diagnostics:    Spirometry was performed and demonstrated an FEV1 of 1.98 at 73 % of predicted.  Assessment and Plan:   1. Not well controlled severe persistent asthma   2. Other allergic rhinitis   3. Adverse effect of drug, subsequent encounter     1. Continue BREO 200 one inhalation 1 time per day  2. START Spiriva 1.25 Respimat - 2 inhalations 1 time per day  3.  Depo-Medrol 80 IM delivered in clinic today  4.  Discontinue atorvastatin  5. Continue Montelukast 10 mg tablet 1 time per  day  6.  Continue Xolair and AUVI-Q 0.3  7. If needed:   A. OTC Allegra 180 - 1 tablet 1 time per day  B. Proventil HFA or similar 2 inhalations every 4-6 hour  C. OTC Pataday - 1 drop each eye 1 time per day  8. Contact clinic in 4 weeks with update  9. Return to clinic in 12 months or earlier if problem.  10. Obtain fall flu vaccine  The only significant change that Andrea Terrell has had occur in conjunction with her increased respiratory tract symptoms s the initiation of atorvastatin therapy.  We will have  her discontinue this agent at this point and will treat her for inflammation of her airway as noted above.  I have asked her to contact me in a few weeks noting her response to this approach.  If she clears up everything then we will have her restart her atorvastatin and see if her symptoms return.  [name], MD Allergy / Immunology [facility]

## 2020-10-18 ENCOUNTER — Encounter: Payer: Self-pay | Admitting: Allergy and Immunology

## 2020-10-18 ENCOUNTER — Ambulatory Visit: Payer: BC Managed Care – PPO | Admitting: Allergy and Immunology

## 2020-10-25 ENCOUNTER — Telehealth: Payer: Self-pay | Admitting: Allergy and Immunology

## 2020-10-25 ENCOUNTER — Ambulatory Visit: Payer: BC Managed Care – PPO | Admitting: Allergy and Immunology

## 2020-10-25 ENCOUNTER — Other Ambulatory Visit: Payer: Self-pay

## 2020-10-25 VITALS — BP 126/88 | HR 100 | Resp 21

## 2020-10-25 DIAGNOSIS — R059 Cough, unspecified: Secondary | ICD-10-CM

## 2020-10-25 DIAGNOSIS — J4550 Severe persistent asthma, uncomplicated: Secondary | ICD-10-CM | POA: Diagnosis not present

## 2020-10-25 MED ORDER — AZITHROMYCIN 500 MG PO TABS: 500.0000 mg | ORAL_TABLET | Freq: Every day | ORAL | 0 refills | Status: AC

## 2020-10-25 NOTE — Progress Notes (Signed)
Andrea Terrell returns to this clinic in evaluation of her asthma.  I just saw her in this clinic a few days ago and we gave her systemic steroids and a anticholinergic agent to add into her other treatment and she is not really that much better.  When questioning her about what her symptoms feel like compared to her previous issues with asthma she says that the symptoms are very familiar to other points in time in which she did not have control of her asthma and there is nothing different about the symptoms compared to what she has had in the past.  She does not have any leg pain or leg swelling or other symptoms to suggest that she may have a clot or pulmonary embolus.  She does not have any chest pain or exertional respiratory tract issues or cardiac issues.  She was COVID-negative today.  We will treat her with some oral steroids and a broad-spectrum antibiotic to cover mycoplasma.  Assuming she does well with this plan I will see her back in his clinic in 1 year but she will keep in contact with [place] noting this response.

## 2020-10-25 NOTE — Telephone Encounter (Signed)
Patient states the Spiriva inhaler is not working very well. Patient is still not feeling well and wants to know if there is another prescription she can try or if she needs to come in for an OV.   Best callback number- [phone] (will be at this number until 3p)

## 2020-10-25 NOTE — Telephone Encounter (Signed)
Please have her obtain a chest x-ray and lets have her come by for a COVID swab.

## 2020-10-25 NOTE — Patient Instructions (Addendum)
  1. Continue BREO 200 one inhalation 1 time per day  2. Continue Spiriva 1.25 Respimat - 2 inhalations 1 time per day  3. Continue Montelukast 10 mg tablet 1 time per day  4. Continue Xolair (and AUVI-Q 0.3)  5. If needed:   A. OTC Allegra 180 - 1 tablet 1 time per day  B. Proventil HFA or similar 2 inhalations every 4-6 hour  C. OTC Pataday - 1 drop each eye 1 time per day  6. Prednisone 10 mg - 1 tablet 1 time per day for 10 days only  7. Azithromycin 500 - 1 tablet 1 time per day for 3 days only  8. When doing better can discontinue Spiriva  9. Further evaluation???  10. Return to clinic in 12 months or earlier if problem.  11. Obtain fall flu vaccine

## 2020-10-25 NOTE — Telephone Encounter (Signed)
Please advise if you want Lupe to come in for an OV.

## 2020-10-25 NOTE — Telephone Encounter (Signed)
Patient prefers to see Dr. Lucie Leather, scheduled an OV for today at 3:40pm.

## 2020-10-30 ENCOUNTER — Encounter: Payer: Self-pay | Admitting: Allergy and Immunology

## 2020-11-06 ENCOUNTER — Other Ambulatory Visit: Payer: Self-pay

## 2020-11-06 ENCOUNTER — Encounter: Payer: Self-pay | Admitting: Pulmonary Disease

## 2020-11-06 ENCOUNTER — Ambulatory Visit (INDEPENDENT_AMBULATORY_CARE_PROVIDER_SITE_OTHER): Payer: BC Managed Care – PPO

## 2020-11-06 ENCOUNTER — Ambulatory Visit: Payer: BC Managed Care – PPO | Admitting: Pulmonary Disease

## 2020-11-06 VITALS — BP 130/82 | HR 106 | Ht 65.0 in | Wt 228.4 lb

## 2020-11-06 DIAGNOSIS — R0789 Other chest pain: Secondary | ICD-10-CM | POA: Diagnosis not present

## 2020-11-06 DIAGNOSIS — R0609 Other forms of dyspnea: Secondary | ICD-10-CM

## 2020-11-06 DIAGNOSIS — J4550 Severe persistent asthma, uncomplicated: Secondary | ICD-10-CM

## 2020-11-06 DIAGNOSIS — R0600 Dyspnea, unspecified: Secondary | ICD-10-CM

## 2020-11-06 MED ORDER — BREZTRI AEROSPHERE 160-9-4.8 MCG/ACT IN AERO
2.0000 | INHALATION_SPRAY | Freq: Two times a day (BID) | RESPIRATORY_TRACT | 0 refills | Status: DC
Start: 2020-11-06 — End: 2020-11-06

## 2020-11-06 MED ORDER — BREZTRI AEROSPHERE 160-9-4.8 MCG/ACT IN AERO: 2.0000 | INHALATION_SPRAY | Freq: Two times a day (BID) | RESPIRATORY_TRACT | 0 refills | Status: DC

## 2020-11-06 NOTE — Patient Instructions (Signed)
Nice to meet you  I think we need to do some additional investigation in regards to your symptoms.  Your recent blood work shows no anemia which is good.  The eosinophils are little bit high which can drive asthma symptoms.  I know the IgE has been high and why the Xolair has been effective in the past.  We will get blood work today, repeat IgE level on Xolair to see what is going on.  If elevated we may need to have a discussion about changing the dose.  In addition given your heart rate is up and the breathing problems came out of the blue, I would like to evaluate for a blood clot.  We will do a blood test today.  If that is normal, no further action is needed.  If it is elevated, we will need to do a CT scan to look into if there is a blood clot in the lungs causing her symptoms.  Lastly, we will get a chest x-ray today to evaluate other causes of your shortness of breath.  If all the tests above look good, I recommend we move forward with a heart ultrasound to make sure the heart looks okay.  Return to clinic in 4 weeks or sooner as needed

## 2020-11-07 LAB — IGE: IgE (Immunoglobulin E), Serum: 113 kU/L (ref <=114)

## 2020-11-07 LAB — D-DIMER, QUANTITATIVE: D-Dimer, Quant: 0.97 mcg/mL FEU — ABNORMAL HIGH (ref <0.50)

## 2020-11-08 ENCOUNTER — Ambulatory Visit (INDEPENDENT_AMBULATORY_CARE_PROVIDER_SITE_OTHER)
Admission: RE | Admit: 2020-11-08 | Discharge: 2020-11-08 | Disposition: A | Discharge status: Home / Self Care | Payer: BC Managed Care – PPO | Source: Ambulatory Visit | Attending: Pulmonary Disease | Admitting: Pulmonary Disease

## 2020-11-08 ENCOUNTER — Other Ambulatory Visit: Payer: Self-pay

## 2020-11-08 ENCOUNTER — Ambulatory Visit: Payer: BC Managed Care – PPO

## 2020-11-08 ENCOUNTER — Other Ambulatory Visit: Payer: Self-pay | Admitting: Pulmonary Disease

## 2020-11-08 DIAGNOSIS — K449 Diaphragmatic hernia without obstruction or gangrene: Secondary | ICD-10-CM | POA: Diagnosis not present

## 2020-11-08 DIAGNOSIS — R0600 Dyspnea, unspecified: Secondary | ICD-10-CM

## 2020-11-08 DIAGNOSIS — R0609 Other forms of dyspnea: Secondary | ICD-10-CM

## 2020-11-08 DIAGNOSIS — R0789 Other chest pain: Secondary | ICD-10-CM | POA: Diagnosis not present

## 2020-11-08 MED ORDER — IOHEXOL 350 MG/ML SOLN
80.0000 mL | Freq: Once | INTRAVENOUS | Status: AC | PRN
  Administered 2020-11-08: 80 mL via INTRAVENOUS

## 2020-11-08 NOTE — Progress Notes (Signed)
@Patient  ID: , female    DOB: 11-10-1965, 55 y.o.   MRN: [id number]  Chief Complaint  Patient presents with   Consult    Pt states trouble breathing.    Referring provider: [id number], MD  HPI:   55 y.o. woman whom we are seeing in consultation for dyspnea on exertion.  PCP note reviewed.  Most recent allergy and immunology note x2 reviewed.  Patient was in normal state of health about 3 months ago.  Relatively abrupt onset of significant dyspnea on exertion.  Very winded with steps, inclines.  Winded on flat surfaces as well.  No clear inciting event.  She denies viral infection or other infection, trauma etc.  She has been seen by PCP.  Also seen by allergy and immunology.  Received 2 courses of prednisone, course of doxycycline.  She thinks these provided mild intermittent relief but quickly returned to baseline worsening dyspnea shortly after completion.  She reports adherence to high-dose Breo, montelukast, Xolair injections every month.  She is on these injections for many years.  In some ways seems similar to prior asthma exacerbations but in other ways different.  She has CT abdomen pelvis 2019 with no comment on abnormality in the lower part of the lungs.  PMH: Asthma, seasonal allergies, Surgical history: Hernia repair, adenoidectomy, tonsillectomy, tubal ligation Family history: Mother COPD, CAD, father with hypertension Social history: Minimal smoking history, approximate 2 pack years while in college, works at [employer] at [employer], sport and exercise, lives in [place]   Questionaires / Pulmonary Flowsheets:   ACT:  Asthma Control Test ACT Total Score  04/28/2019 11  08/17/2017 22  01/14/2017 19    MMRC: No flowsheet data found.  Epworth:  Results of the Epworth flowsheet 01/04/2016  Sitting and reading 0  Watching TV 0  Sitting, inactive in a public place (e.g. a theatre or a meeting) 0  As a passenger in a car for an hour without a break 2  Lying  down to rest in the afternoon when circumstances permit 1  Sitting and talking to someone 0  Sitting quietly after a lunch without alcohol 0  In a car, while stopped for a few minutes in traffic 0  Total score 3    Tests:   FENO:  No results found for: NITRICOXIDE  PFT: No flowsheet data found.  WALK:  No flowsheet data found.  Imaging: Personally reviewed as per EMR discussion this note DG Chest 2 View  Result Date: 11/06/2020 CLINICAL DATA:  55 year old female with dyspnea on exertion EXAM: CHEST - 2 VIEW COMPARISON:  10/23/2009 FINDINGS: Cardiomediastinal silhouette unchanged in size and contour. No evidence of central vascular congestion. No interlobular septal thickening. No pneumothorax or pleural effusion. Coarsened interstitial markings, with no confluent airspace disease. No acute displaced fracture. Degenerative changes of the spine. IMPRESSION: Negative for acute cardiopulmonary disease Electronically Signed   By: 10/25/2009 D.O.   On: 11/06/2020 15:29    Lab Results:  CBC No results found for: WBC, RBC, HGB, HCT, PLT, MCV, MCH, MCHC, RDW, LYMPHSABS, MONOABS, EOSABS, BASOSABS  BMET No results found for: NA, K, CL, CO2, GLUCOSE, BUN, CREATININE, CALCIUM, GFRNONAA, GFRAA  BNP No results found for: BNP  ProBNP No results found for: PROBNP  Specialty Problems       Pulmonary Problems   Severe persistent asthma   OSA (obstructive sleep apnea)    Allergies  Allergen Reactions   Ciprofloxacin Other (See Comments)  Joint pain   Codeine Other (See Comments)    NERVOUSNESS   Keflex [Cephalexin] Hives   Penicillins Rash   Sulfa Antibiotics Rash    Immunization History  Administered Date(s) Administered   Influenza,inj,Quad PF,6+ Mos 10/26/2015   Moderna Sars-Covid-2 Vaccination 04/07/2019, 05/02/2019    Past Medical History:  Diagnosis Date   Asthma    Hypothyroid    OSA (obstructive sleep apnea) 02/08/2016    Tobacco History: Social History    Tobacco Use  Smoking Status Former   Packs/day: 0.50   Years: 5.00   Pack years: 2.50   Types: Cigarettes   Quit date: 1989   Years since quitting: 33.7  Smokeless Tobacco Never   Counseling given: Not Answered   Continue to not smoke  Outpatient Encounter Medications as of 11/06/2020  Medication Sig   albuterol (PROAIR HFA) 108 (90 Base) MCG/ACT inhaler Inhale 2 puffs every 4-6 hours if needed for cough or wheeze.   atorvastatin (LIPITOR) 20 MG tablet Take 20 mg by mouth daily.   AUVI-Q 0.3 MG/0.3ML SOAJ injection Use as directed for life-threatening allergic reaction   doxycycline (PERIOSTAT) 20 MG tablet Take 20 mg by mouth 2 (two) times daily.   fexofenadine (ALLEGRA) 180 MG tablet Take 180 mg by mouth daily as needed for allergies or rhinitis.   finasteride (PROSCAR) 5 MG tablet    fluticasone furoate-vilanterol (BREO ELLIPTA) 200-25 MCG/INH AEPB Inhale 1 puff into the lungs daily. Rinse, gargle, and spit after use.   levothyroxine (SYNTHROID, LEVOTHROID) 75 MCG tablet    montelukast (SINGULAIR) 10 MG tablet Take 1 tablet (10 mg total) by mouth daily.   NON FORMULARY CPAP nightly   Tiotropium Bromide Monohydrate (SPIRIVA RESPIMAT) 1.25 MCG/ACT AERS Inhale 2 puffs into the lungs daily.   valACYclovir (VALTREX) 1000 MG tablet Take 4,000 mg by mouth once.   venlafaxine XR (EFFEXOR-XR) 75 MG 24 hr capsule    Vitamin D, Ergocalciferol, (DRISDOL) 1.25 MG (50000 UT) CAPS capsule    VYVANSE 30 MG capsule Take 30 mg by mouth daily as needed.   XOLAIR 150 MG injection INJECT 300 MG SUBCUTANEOUSLY EVERY 4 WEEKS.   [DISCONTINUED] Budeson-Glycopyrrol-Formoterol (BREZTRI AEROSPHERE) 160-9-4.8 MCG/ACT AERO Inhale 2 puffs into the lungs in the morning and at bedtime.   Budeson-Glycopyrrol-Formoterol (BREZTRI AEROSPHERE) 160-9-4.8 MCG/ACT AERO Inhale 2 puffs into the lungs in the morning and at bedtime.   Facility-Administered Encounter Medications as of 11/06/2020  Medication    omalizumab Geoffry Paradise) injection 300 mg   omalizumab Geoffry Paradise) prefilled syringe 300 mg     Review of Systems  Review of Systems  No chest pain with exertion.  No orthopnea or PND.  Comprehensive review of systems otherwise negative. Physical Exam  BP 130/82 (BP Location: Right Arm, Patient Position: Sitting, Cuff Size: Normal)   Pulse (!) 106   Ht 5\' 5"  (1.651 m)   Wt 228 lb 6.4 oz (103.6 kg)   SpO2 98%   BMI 38.01 kg/m   Wt Readings from Last 5 Encounters:  11/06/20 228 lb 6.4 oz (103.6 kg)  07/19/20 228 lb (103.4 kg)  04/28/19 232 lb 3.2 oz (105.3 kg)  06/26/17 193 lb (87.5 kg)  06/06/16 222 lb 9.6 oz (101 kg)    BMI Readings from Last 5 Encounters:  11/06/20 38.01 kg/m  07/19/20 37.59 kg/m  04/28/19 38.29 kg/m  06/26/17 32.12 kg/m  06/06/16 37.04 kg/m     Physical Exam General: In no distress, sitting in chair Eyes: EOMI, icterus Neck: Supple,  no JVP Pulmonary: Clear, no wheeze, normal work of breathing Cardiovascular: Tachycardic, regular rhythm, no murmur Abdomen: Nondistended, bowel sounds present MSK: No synovitis, joint effusion Neuro: Normal gait, no weakness Psych: Normal mood, full affect   Assessment & Plan:   Dyspnea on exertion: Unclear etiology.  Longstanding history of well-controlled severe asthma on Xolair and ICS/LABA and montelukast.  Abrupt worsening over the last 2 to 3 months.  No clear inciting event.  Has had several courses of antibiotics and prednisone with mild intermittent relief but symptoms persist.  Differential includes uncontrolled asthma but not sure why with such aggressive medications this would worsen suddenly.  Possible cardiac etiology since this has not been evaluated.  Lastly, given abrupt onset concern for occult PE.  We will repeat IgE today, obtain D-dimer.  Chest x-ray today.  Consider TTE in the future if work-up unrevealing.  Severe persistent asthma in remission: As of late worsening symptoms despite Xolair.   Repeat IgE.  Will escalate inhaler therapy to Breztri from high-dose Breo.  Can consider repeat phenotyping and alteration of biologic therapy in the future.  Will discuss with allergy/immunology provider if work-up above unrevealing.   Return in about 4 weeks (around 12/04/2020).   Karren Burly, MD 11/08/2020

## 2020-11-08 NOTE — Progress Notes (Signed)
CTA clear, parenchyma clear, no PE. Had been stable on aggressive asthma regimen. TTE to evaluate cardiac etiologies of DOE.

## 2020-11-08 NOTE — Progress Notes (Signed)
D-Dimer elevated, IgE WNL, CXR clear. CTA to evaluate possible PE ordered. Routing to staff as Lorain Childes.

## 2020-11-14 DIAGNOSIS — J4550 Severe persistent asthma, uncomplicated: Secondary | ICD-10-CM

## 2020-11-15 ENCOUNTER — Ambulatory Visit (INDEPENDENT_AMBULATORY_CARE_PROVIDER_SITE_OTHER): Payer: BC Managed Care – PPO

## 2020-11-15 DIAGNOSIS — M542 Cervicalgia: Secondary | ICD-10-CM | POA: Diagnosis not present

## 2020-11-15 DIAGNOSIS — J4550 Severe persistent asthma, uncomplicated: Secondary | ICD-10-CM

## 2020-11-15 DIAGNOSIS — M9901 Segmental and somatic dysfunction of cervical region: Secondary | ICD-10-CM | POA: Diagnosis not present

## 2020-11-15 DIAGNOSIS — M62830 Muscle spasm of back: Secondary | ICD-10-CM | POA: Diagnosis not present

## 2020-11-15 DIAGNOSIS — M9902 Segmental and somatic dysfunction of thoracic region: Secondary | ICD-10-CM | POA: Diagnosis not present

## 2020-11-19 ENCOUNTER — Telehealth: Payer: Self-pay | Admitting: *Deleted

## 2020-11-19 NOTE — Telephone Encounter (Signed)
Called patient to advise per [facility] policy we can no longer buy and bill her Xolair and will need to submit to [facility] and they will reach out to get ok to ship for next injections

## 2020-11-22 ENCOUNTER — Other Ambulatory Visit: Payer: BC Managed Care – PPO

## 2020-11-23 ENCOUNTER — Other Ambulatory Visit: Payer: Self-pay

## 2020-11-23 ENCOUNTER — Ambulatory Visit (INDEPENDENT_AMBULATORY_CARE_PROVIDER_SITE_OTHER): Payer: BC Managed Care – PPO

## 2020-11-23 DIAGNOSIS — F332 Major depressive disorder, recurrent severe without psychotic features: Secondary | ICD-10-CM | POA: Diagnosis not present

## 2020-11-23 DIAGNOSIS — R0600 Dyspnea, unspecified: Secondary | ICD-10-CM

## 2020-11-23 DIAGNOSIS — R0609 Other forms of dyspnea: Secondary | ICD-10-CM

## 2020-11-23 DIAGNOSIS — F411 Generalized anxiety disorder: Secondary | ICD-10-CM | POA: Diagnosis not present

## 2020-11-23 DIAGNOSIS — F5081 Binge eating disorder: Secondary | ICD-10-CM | POA: Diagnosis not present

## 2020-11-23 LAB — ECHOCARDIOGRAM COMPLETE
Area-P 1/2: 3.28 cm2
Calc EF: 55.5 %
S' Lateral: 3.1 cm
Single Plane A2C EF: 50.5 %
Single Plane A4C EF: 59.6 %

## 2020-11-23 NOTE — Progress Notes (Signed)
Echocardiogram normal with exception of grade 1 diastolic dysfunction. Do not think this is enough to cause severity of her symptoms.

## 2020-11-29 DIAGNOSIS — M542 Cervicalgia: Secondary | ICD-10-CM | POA: Diagnosis not present

## 2020-11-29 DIAGNOSIS — M9901 Segmental and somatic dysfunction of cervical region: Secondary | ICD-10-CM | POA: Diagnosis not present

## 2020-11-29 DIAGNOSIS — M62830 Muscle spasm of back: Secondary | ICD-10-CM | POA: Diagnosis not present

## 2020-11-29 DIAGNOSIS — M9902 Segmental and somatic dysfunction of thoracic region: Secondary | ICD-10-CM | POA: Diagnosis not present

## 2020-12-13 ENCOUNTER — Ambulatory Visit (INDEPENDENT_AMBULATORY_CARE_PROVIDER_SITE_OTHER): Payer: BC Managed Care – PPO | Admitting: *Deleted

## 2020-12-13 ENCOUNTER — Ambulatory Visit: Payer: BC Managed Care – PPO | Admitting: Pulmonary Disease

## 2020-12-13 ENCOUNTER — Other Ambulatory Visit: Payer: Self-pay

## 2020-12-13 DIAGNOSIS — J4550 Severe persistent asthma, uncomplicated: Secondary | ICD-10-CM | POA: Diagnosis not present

## 2020-12-20 DIAGNOSIS — L719 Rosacea, unspecified: Secondary | ICD-10-CM | POA: Diagnosis not present

## 2020-12-20 DIAGNOSIS — B351 Tinea unguium: Secondary | ICD-10-CM | POA: Diagnosis not present

## 2021-01-04 ENCOUNTER — Ambulatory Visit: Payer: BC Managed Care – PPO | Admitting: Pulmonary Disease

## 2021-01-04 ENCOUNTER — Encounter: Payer: Self-pay | Admitting: Pulmonary Disease

## 2021-01-04 ENCOUNTER — Other Ambulatory Visit: Payer: Self-pay

## 2021-01-04 VITALS — BP 130/90 | HR 95 | Temp 99.6°F | Ht 65.0 in | Wt 221.4 lb

## 2021-01-04 DIAGNOSIS — Z23 Encounter for immunization: Secondary | ICD-10-CM

## 2021-01-04 DIAGNOSIS — J4550 Severe persistent asthma, uncomplicated: Secondary | ICD-10-CM | POA: Diagnosis not present

## 2021-01-04 MED ORDER — BREZTRI AEROSPHERE 160-9-4.8 MCG/ACT IN AERO: 2.0000 | INHALATION_SPRAY | Freq: Two times a day (BID) | RESPIRATORY_TRACT | 0 refills | Status: DC

## 2021-01-04 MED ORDER — BREZTRI AEROSPHERE 160-9-4.8 MCG/ACT IN AERO: 2.0000 | INHALATION_SPRAY | Freq: Two times a day (BID) | RESPIRATORY_TRACT | 11 refills | Status: DC

## 2021-01-04 NOTE — Progress Notes (Signed)
@Patient  ID: , female    DOB: 29-May-1965, 55 y.o.   MRN: [id number]  Chief Complaint  Patient presents with   Follow-up    Flu vaccine today Asthma     Referring provider: [id number], MD  HPI:   55 y.o. woman whom we are seeing in follow-up for dyspnea on exertion and asthma.  Asthma previously well controlled on Xolair.  Worsened symptoms over the summer/fall 2022.  Fortunately great improvement switching Breo/Spiriva to 2023.  TTE obtained which is reassuring.  Patient returns to clinic for follow-up.  Dyspnea much improved.  After about 2 weeks on the [place] it helped a lot.  No nocturnal symptoms.  Has not used albuterol in the few weeks.  Some mild dyspnea when out around the wheeze, doing yard work.  Otherwise doing much better.  Reviewed results of echocardiogram, reassuring only mild diastolic dysfunction.  Reviewed results of IgE, appropriately suppressed in the setting of Xolair therapy.  HPI initial visit: Patient was in normal state of health about 3 months ago.  Relatively abrupt onset of significant dyspnea on exertion.  Very winded with steps, inclines.  Winded on flat surfaces as well.  No clear inciting event.  She denies viral infection or other infection, trauma etc.  She has been seen by PCP.  Also seen by allergy and immunology.  Received 2 courses of prednisone, course of doxycycline.  She thinks these provided mild intermittent relief but quickly returned to baseline worsening dyspnea shortly after completion.  She reports adherence to high-dose Breo, montelukast, Xolair injections every month.  She is on these injections for many years.  In some ways seems similar to prior asthma exacerbations but in other ways different.  She has CT abdomen pelvis 2019 with no comment on abnormality in the lower part of the lungs.  PMH: Asthma, seasonal allergies, Surgical history: Hernia repair, adenoidectomy, tonsillectomy, tubal ligation Family  history: Mother COPD, CAD, father with hypertension Social history: Minimal smoking history, approximate 2 pack years while in college, works at [employer] at [employer], sport and exercise, lives in [place]   Questionaires / Pulmonary Flowsheets:   ACT:  Asthma Control Test ACT Total Score  01/04/2021 22  04/28/2019 11  08/17/2017 22    MMRC: No flowsheet data found.  Epworth:  Results of the Epworth flowsheet 01/04/2016  Sitting and reading 0  Watching TV 0  Sitting, inactive in a public place (e.g. a theatre or a meeting) 0  As a passenger in a car for an hour without a break 2  Lying down to rest in the afternoon when circumstances permit 1  Sitting and talking to someone 0  Sitting quietly after a lunch without alcohol 0  In a car, while stopped for a few minutes in traffic 0  Total score 3    Tests:   FENO:  No results found for: NITRICOXIDE  PFT: No flowsheet data found.  WALK:  No flowsheet data found.  Imaging: Personally reviewed as per EMR discussion this note   Lab Results: Personally reviewed and as per EMR CBC No results found for: WBC, RBC, HGB, HCT, PLT, MCV, MCH, MCHC, RDW, LYMPHSABS, MONOABS, EOSABS, BASOSABS  BMET No results found for: NA, K, CL, CO2, GLUCOSE, BUN, CREATININE, CALCIUM, GFRNONAA, GFRAA  BNP No results found for: BNP  ProBNP No results found for: PROBNP  Specialty Problems       Pulmonary Problems   Severe persistent asthma   OSA (obstructive sleep apnea)  Allergies  Allergen Reactions   Ciprofloxacin Other (See Comments)    Joint pain   Codeine Other (See Comments)    NERVOUSNESS   Keflex [Cephalexin] Hives   Penicillins Rash   Sulfa Antibiotics Rash    Immunization History  Administered Date(s) Administered   Influenza Inj Mdck Quad Pf 11/21/2016, 11/26/2018   Influenza Split 12/05/2009, 11/18/2011, 11/05/2012, 11/25/2013   Influenza,inj,Quad PF,6+ Mos 10/26/2015, 11/19/2017   Influenza-Unspecified 10/26/2015    Moderna Sars-Covid-2 Vaccination 04/07/2019, 05/02/2019   Pneumococcal Polysaccharide-23 07/16/2012    Past Medical History:  Diagnosis Date   Asthma    Hypothyroid    OSA (obstructive sleep apnea) 02/08/2016    Tobacco History: Social History   Tobacco Use  Smoking Status Former   Packs/day: 0.50   Years: 5.00   Pack years: 2.50   Types: Cigarettes   Quit date: 1989   Years since quitting: 33.8  Smokeless Tobacco Never   Counseling given: Not Answered   Continue to not smoke  Outpatient Encounter Medications as of 01/04/2021  Medication Sig   albuterol (PROAIR HFA) 108 (90 Base) MCG/ACT inhaler Inhale 2 puffs every 4-6 hours if needed for cough or wheeze.   atorvastatin (LIPITOR) 20 MG tablet Take 20 mg by mouth daily.   AUVI-Q 0.3 MG/0.3ML SOAJ injection Use as directed for life-threatening allergic reaction   Budeson-Glycopyrrol-Formoterol (BREZTRI AEROSPHERE) 160-9-4.8 MCG/ACT AERO Inhale 2 puffs into the lungs in the morning and at bedtime.   doxycycline (PERIOSTAT) 20 MG tablet Take 20 mg by mouth 2 (two) times daily.   fexofenadine (ALLEGRA) 180 MG tablet Take 180 mg by mouth daily as needed for allergies or rhinitis.   finasteride (PROSCAR) 5 MG tablet    levothyroxine (SYNTHROID, LEVOTHROID) 75 MCG tablet    montelukast (SINGULAIR) 10 MG tablet Take 1 tablet (10 mg total) by mouth daily.   NON FORMULARY CPAP nightly   valACYclovir (VALTREX) 1000 MG tablet Take 4,000 mg by mouth once.   venlafaxine XR (EFFEXOR-XR) 75 MG 24 hr capsule    VYVANSE 30 MG capsule Take 30 mg by mouth daily as needed.   XOLAIR 150 MG injection INJECT 300 MG SUBCUTANEOUSLY EVERY 4 WEEKS.   [DISCONTINUED] Budeson-Glycopyrrol-Formoterol (BREZTRI AEROSPHERE) 160-9-4.8 MCG/ACT AERO Inhale 2 puffs into the lungs in the morning and at bedtime.   Budeson-Glycopyrrol-Formoterol (BREZTRI AEROSPHERE) 160-9-4.8 MCG/ACT AERO Inhale 2 puffs into the lungs in the morning and at bedtime.   Vitamin D,  Ergocalciferol, (DRISDOL) 1.25 MG (50000 UT) CAPS capsule  (Patient not taking: Reported on 01/04/2021)   [DISCONTINUED] Tiotropium Bromide Monohydrate (SPIRIVA RESPIMAT) 1.25 MCG/ACT AERS Inhale 2 puffs into the lungs daily. (Patient not taking: Reported on 01/04/2021)   Facility-Administered Encounter Medications as of 01/04/2021  Medication   omalizumab Geoffry Paradise) injection 300 mg   omalizumab (XOLAIR) prefilled syringe 300 mg     Review of Systems  Review of Systems  N/a  Physical Exam  BP 130/90 (BP Location: Right Arm, Cuff Size: Large)   Pulse 95   Temp 99.6 F (37.6 C) (Oral)   Ht 5\' 5"  (1.651 m)   Wt 221 lb 6.4 oz (100.4 kg)   SpO2 99%   BMI 36.84 kg/m   Wt Readings from Last 5 Encounters:  01/04/21 221 lb 6.4 oz (100.4 kg)  11/06/20 228 lb 6.4 oz (103.6 kg)  07/19/20 228 lb (103.4 kg)  04/28/19 232 lb 3.2 oz (105.3 kg)  06/26/17 193 lb (87.5 kg)    BMI Readings from Last  5 Encounters:  01/04/21 36.84 kg/m  11/06/20 38.01 kg/m  07/19/20 37.59 kg/m  04/28/19 38.29 kg/m  06/26/17 32.12 kg/m     Physical Exam General: In no distress, sitting in chair Eyes: EOMI, no icterus Neck: Supple, no JVP Pulmonary: Clear, no wheeze, normal work of breathing Cardiovascular: Regular rate, regular rhythm, no murmur MSK: No synovitis, joint effusion Neuro: Normal gait, no weakness Psych: Normal mood, full affect   Assessment & Plan:   Dyspnea on exertion: Initially etiology unclear, felt to be due to uncontrolled asthma that improved with changing inhaler therapy from Breo/Spiriva to [place] HFA.  Chest imaging clear.  TTE reassuring, only grade 1 diastolic dysfunction.    Severe persistent asthma in remission: As of late worsening symptoms despite Xolair.  Repeat IgE within normal limits.  Symptoms improved with change to Breztri from high-dose Breo/Spiriva.  Healthcare maintenance: Flu shot today.  Return in about 3 months (around 04/06/2021).   Karren Burly, MD 01/04/2021

## 2021-01-04 NOTE — Patient Instructions (Addendum)
Nice to see you again  Continue Breztri 2 puffs twice a day every day.  Provided some samples today.  In addition with a co-pay card in case the co-pay from insurance is high.  I will send in a new prescription.  No no changes to your medications.  Flu shot today  Return to clinic in 3 months or sooner as needed with Dr. Judeth Horn

## 2021-01-10 ENCOUNTER — Ambulatory Visit (INDEPENDENT_AMBULATORY_CARE_PROVIDER_SITE_OTHER): Payer: BC Managed Care – PPO

## 2021-01-10 ENCOUNTER — Other Ambulatory Visit: Payer: Self-pay

## 2021-01-10 DIAGNOSIS — J4550 Severe persistent asthma, uncomplicated: Secondary | ICD-10-CM

## 2021-02-07 ENCOUNTER — Ambulatory Visit (INDEPENDENT_AMBULATORY_CARE_PROVIDER_SITE_OTHER): Payer: BC Managed Care – PPO | Admitting: *Deleted

## 2021-02-07 DIAGNOSIS — J4540 Moderate persistent asthma, uncomplicated: Secondary | ICD-10-CM | POA: Diagnosis not present

## 2021-02-08 DIAGNOSIS — F411 Generalized anxiety disorder: Secondary | ICD-10-CM | POA: Diagnosis not present

## 2021-02-08 DIAGNOSIS — F5081 Binge eating disorder: Secondary | ICD-10-CM | POA: Diagnosis not present

## 2021-02-08 DIAGNOSIS — F332 Major depressive disorder, recurrent severe without psychotic features: Secondary | ICD-10-CM | POA: Diagnosis not present

## 2021-02-14 DIAGNOSIS — L719 Rosacea, unspecified: Secondary | ICD-10-CM | POA: Diagnosis not present

## 2021-02-14 DIAGNOSIS — B351 Tinea unguium: Secondary | ICD-10-CM | POA: Diagnosis not present

## 2021-03-07 ENCOUNTER — Other Ambulatory Visit: Payer: Self-pay

## 2021-03-07 ENCOUNTER — Ambulatory Visit (INDEPENDENT_AMBULATORY_CARE_PROVIDER_SITE_OTHER): Payer: BC Managed Care – PPO | Admitting: *Deleted

## 2021-03-07 DIAGNOSIS — J4540 Moderate persistent asthma, uncomplicated: Secondary | ICD-10-CM | POA: Diagnosis not present

## 2021-04-04 ENCOUNTER — Ambulatory Visit (INDEPENDENT_AMBULATORY_CARE_PROVIDER_SITE_OTHER): Payer: BC Managed Care – PPO | Admitting: *Deleted

## 2021-04-04 ENCOUNTER — Other Ambulatory Visit: Payer: Self-pay

## 2021-04-04 DIAGNOSIS — J4550 Severe persistent asthma, uncomplicated: Secondary | ICD-10-CM

## 2021-04-05 ENCOUNTER — Encounter: Payer: Self-pay | Admitting: Pulmonary Disease

## 2021-04-05 ENCOUNTER — Ambulatory Visit: Payer: BC Managed Care – PPO | Admitting: Pulmonary Disease

## 2021-04-05 VITALS — BP 126/74 | HR 92 | Temp 98.0°F | Ht 65.0 in | Wt 223.4 lb

## 2021-04-05 DIAGNOSIS — J4550 Severe persistent asthma, uncomplicated: Secondary | ICD-10-CM | POA: Diagnosis not present

## 2021-04-05 NOTE — Progress Notes (Signed)
@Patient  ID: Andrea Terrell, female    DOB: [birth year], 56 y.o.   MRN: [id number]  Chief Complaint  Patient presents with   Follow-up    3 month follow up. Pt states that she is doing a lot better. Pt states she does have some wheezing noted but thinks its due to the weather changing.     Referring provider: Paulina Fusi, MD  HPI:   56 y.o. woman whom we are seeing in follow-up for dyspnea on exertion and asthma.  Asthma previously well controlled on Xolair.  Worsened symptoms over the summer/fall 2022.  Fortunately great improvement switching Breo/Spiriva to [employer].  TTE obtained which is reassuring.  Return for routine follow up. Doing ok. Occasional wheeze but not unsual for her with swings in temperature like have been occurring last couple of weeks.  Using albuterol about once a week.  More nasal congestion, postnasal drip.    HPI initial visit: Patient was in normal state of health about 3 months ago.  Relatively abrupt onset of significant dyspnea on exertion.  Very winded with steps, inclines.  Winded on flat surfaces as well.  No clear inciting event.  She denies viral infection or other infection, trauma etc.  She has been seen by PCP.  Also seen by allergy and immunology.  Received 2 courses of prednisone, course of doxycycline.  She thinks these provided mild intermittent relief but quickly returned to baseline worsening dyspnea shortly after completion.  She reports adherence to high-dose Breo, montelukast, Xolair injections every month.  She is on these injections for many years.  In some ways seems similar to prior asthma exacerbations but in other ways different.  She has CT abdomen pelvis 2019 with no comment on abnormality in the lower part of the lungs.  PMH: Asthma, seasonal allergies, Surgical history: Hernia repair, adenoidectomy, tonsillectomy, tubal ligation Family history: Mother COPD, CAD, father with hypertension Social history: Minimal smoking history,  approximate 2 pack years while in college, works at [employer], sport and exercise at [employer], lives in [place]   Questionaires / Pulmonary Flowsheets:   ACT:  Asthma Control Test ACT Total Score  04/05/2021 21  01/04/2021 22  04/28/2019 11    MMRC: No flowsheet data found.  Epworth:  Results of the Epworth flowsheet 01/04/2016  Sitting and reading 0  Watching TV 0  Sitting, inactive in a public place (e.g. a theatre or a meeting) 0  As a passenger in a car for an hour without a break 2  Lying down to rest in the afternoon when circumstances permit 1  Sitting and talking to someone 0  Sitting quietly after a lunch without alcohol 0  In a car, while stopped for a few minutes in traffic 0  Total score 3    Tests:   FENO:  No results found for: NITRICOXIDE  PFT: No flowsheet data found.  WALK:  No flowsheet data found.  Imaging: Personally reviewed as per EMR discussion this note   Lab Results: Personally reviewed and as per EMR CBC No results found for: WBC, RBC, HGB, HCT, PLT, MCV, MCH, MCHC, RDW, LYMPHSABS, MONOABS, EOSABS, BASOSABS  BMET No results found for: NA, K, CL, CO2, GLUCOSE, BUN, CREATININE, CALCIUM, GFRNONAA, GFRAA  BNP No results found for: BNP  ProBNP No results found for: PROBNP  Specialty Problems       Pulmonary Problems   Severe persistent asthma   OSA (obstructive sleep apnea)    Allergies  Allergen Reactions  Ciprofloxacin Other (See Comments)    Joint pain   Codeine Other (See Comments)    NERVOUSNESS   Keflex [Cephalexin] Hives   Penicillins Rash   Sulfa Antibiotics Rash    Immunization History  Administered Date(s) Administered   Influenza Inj Mdck Quad Pf 11/21/2016, 11/26/2018   Influenza Split 12/05/2009, 11/18/2011, 11/05/2012, 11/25/2013   Influenza,inj,Quad PF,6+ Mos 10/26/2015, 11/19/2017, 01/04/2021   Influenza-Unspecified 10/26/2015   Moderna Sars-Covid-2 Vaccination 04/07/2019, 05/02/2019   Pneumococcal  Polysaccharide-23 07/16/2012    Past Medical History:  Diagnosis Date   Asthma    Hypothyroid    OSA (obstructive sleep apnea) 02/08/2016    Tobacco History: Social History   Tobacco Use  Smoking Status Former   Packs/day: 0.50   Years: 5.00   Pack years: 2.50   Types: Cigarettes   Quit date: 1989   Years since quitting: 34.1  Smokeless Tobacco Never   Counseling given: Not Answered    Continue to not smoke  Outpatient Encounter Medications as of 04/05/2021  Medication Sig   albuterol (PROAIR HFA) 108 (90 Base) MCG/ACT inhaler Inhale 2 puffs every 4-6 hours if needed for cough or wheeze.   atorvastatin (LIPITOR) 20 MG tablet Take 20 mg by mouth daily.   AUVI-Q 0.3 MG/0.3ML SOAJ injection Use as directed for life-threatening allergic reaction   Budeson-Glycopyrrol-Formoterol (BREZTRI AEROSPHERE) 160-9-4.8 MCG/ACT AERO Inhale 2 puffs into the lungs in the morning and at bedtime.   doxycycline (PERIOSTAT) 20 MG tablet Take 20 mg by mouth 2 (two) times daily.   fexofenadine (ALLEGRA) 180 MG tablet Take 180 mg by mouth daily as needed for allergies or rhinitis.   finasteride (PROSCAR) 5 MG tablet    levothyroxine (SYNTHROID, LEVOTHROID) 75 MCG tablet    montelukast (SINGULAIR) 10 MG tablet Take 1 tablet (10 mg total) by mouth daily.   NON FORMULARY CPAP nightly   valACYclovir (VALTREX) 1000 MG tablet Take 4,000 mg by mouth once.   venlafaxine XR (EFFEXOR-XR) 75 MG 24 hr capsule    Vitamin D, Ergocalciferol, (DRISDOL) 1.25 MG (50000 UT) CAPS capsule    VYVANSE 30 MG capsule Take 30 mg by mouth daily as needed.   XOLAIR 150 MG injection INJECT 300 MG SUBCUTANEOUSLY EVERY 4 WEEKS.   [DISCONTINUED] Budeson-Glycopyrrol-Formoterol (BREZTRI AEROSPHERE) 160-9-4.8 MCG/ACT AERO Inhale 2 puffs into the lungs in the morning and at bedtime.   Facility-Administered Encounter Medications as of 04/05/2021  Medication   omalizumab Geoffry Paradise) injection 300 mg   omalizumab (XOLAIR) prefilled  syringe 300 mg     Review of Systems  Review of Systems  N/a  Physical Exam  BP 126/74 (BP Location: Left Arm, Patient Position: Sitting, Cuff Size: Normal)    Pulse 92    Temp 98 F (36.7 C) (Oral)    Ht 5\' 5"  (1.651 m)    Wt 223 lb 6.4 oz (101.3 kg)    SpO2 99%    BMI 37.18 kg/m   Wt Readings from Last 5 Encounters:  04/05/21 223 lb 6.4 oz (101.3 kg)  01/04/21 221 lb 6.4 oz (100.4 kg)  11/06/20 228 lb 6.4 oz (103.6 kg)  07/19/20 228 lb (103.4 kg)  04/28/19 232 lb 3.2 oz (105.3 kg)    BMI Readings from Last 5 Encounters:  04/05/21 37.18 kg/m  01/04/21 36.84 kg/m  11/06/20 38.01 kg/m  07/19/20 37.59 kg/m  04/28/19 38.29 kg/m     Physical Exam General: In no distress, sitting in chair Eyes: EOMI, no icterus Neck: Supple, no JVP  Pulmonary: Clear, no wheeze, normal work of breathing Cardiovascular: Regular rate, regular rhythm, no murmur MSK: No synovitis, joint effusion Neuro: Normal gait, no weakness Psych: Normal mood, full affect   Assessment & Plan:   Dyspnea on exertion: Initially etiology unclear, felt to be due to uncontrolled asthma that improved with changing inhaler therapy from Breo/Spiriva to [facility] HFA.  Chest imaging clear.  TTE reassuring, only grade 1 diastolic dysfunction.    Severe persistent asthma in remission: As of late worsening symptoms despite Xolair.  Repeat IgE within normal limits.  Symptoms improved with change to Breztri from high-dose Breo/Spiriva.  Rhinosinusitis: Related to allergic type symptoms.  Resume antihistamine in coming weeks if starting sinus rinses did not help.  Sample for sinus rinse given, instructed to use or purchase additional saline packets and use distilled water which she is currently using for her CPAP.  Okay to use 1 to 2 months at a time and de-escalate based on symptoms.  OSA: Good adherence to CPAP.  To continue.  Return in about 6 months (around 10/03/2021).   Karren Burly,  MD 04/05/2021

## 2021-04-05 NOTE — Patient Instructions (Signed)
Nice to see you again!  No changes to medications today  Return to clinic in 6 months or sooner as needed

## 2021-05-02 ENCOUNTER — Other Ambulatory Visit: Payer: Self-pay

## 2021-05-02 ENCOUNTER — Ambulatory Visit (INDEPENDENT_AMBULATORY_CARE_PROVIDER_SITE_OTHER): Payer: BC Managed Care – PPO

## 2021-05-02 DIAGNOSIS — J4550 Severe persistent asthma, uncomplicated: Secondary | ICD-10-CM

## 2021-05-10 DIAGNOSIS — F332 Major depressive disorder, recurrent severe without psychotic features: Secondary | ICD-10-CM | POA: Diagnosis not present

## 2021-05-10 DIAGNOSIS — F411 Generalized anxiety disorder: Secondary | ICD-10-CM | POA: Diagnosis not present

## 2021-05-10 DIAGNOSIS — F5081 Binge eating disorder: Secondary | ICD-10-CM | POA: Diagnosis not present

## 2021-05-30 ENCOUNTER — Ambulatory Visit (INDEPENDENT_AMBULATORY_CARE_PROVIDER_SITE_OTHER): Payer: BC Managed Care – PPO | Admitting: *Deleted

## 2021-05-30 DIAGNOSIS — J4550 Severe persistent asthma, uncomplicated: Secondary | ICD-10-CM | POA: Diagnosis not present

## 2021-06-27 ENCOUNTER — Ambulatory Visit (INDEPENDENT_AMBULATORY_CARE_PROVIDER_SITE_OTHER): Payer: BC Managed Care – PPO | Admitting: *Deleted

## 2021-06-27 DIAGNOSIS — J4550 Severe persistent asthma, uncomplicated: Secondary | ICD-10-CM | POA: Diagnosis not present

## 2021-07-14 DIAGNOSIS — L255 Unspecified contact dermatitis due to plants, except food: Secondary | ICD-10-CM | POA: Diagnosis not present

## 2021-07-16 ENCOUNTER — Telehealth: Payer: Self-pay

## 2021-07-16 ENCOUNTER — Ambulatory Visit: Payer: BC Managed Care – PPO | Admitting: Allergy

## 2021-07-16 ENCOUNTER — Encounter: Payer: Self-pay | Admitting: Allergy

## 2021-07-16 VITALS — BP 124/82 | HR 100 | Resp 16

## 2021-07-16 DIAGNOSIS — L255 Unspecified contact dermatitis due to plants, except food: Secondary | ICD-10-CM

## 2021-07-16 MED ORDER — CLOBETASOL PROPIONATE 0.05 % EX OINT: 1.0000 "application " | TOPICAL_OINTMENT | Freq: Two times a day (BID) | CUTANEOUS | 0 refills | Status: DC

## 2021-07-16 NOTE — Telephone Encounter (Signed)
Patient was added to Dr. Randell Patient schedule for this afternoon

## 2021-07-16 NOTE — Progress Notes (Signed)
Follow-up Note  RE: DOREAN HIEBERT MRN: [id number] DOB: [birth year] Date of Office Visit: 07/16/2021   History of present illness: Andrea Terrell is a 56 y.o. female presenting today for sick visit for rash.  She was last seen in the office on 10/25/20 by Dr. Lucie Leather.  She has history of severe persistent asthma, allergic rhinitis and adverse effect of drug.    Last week (Mothers Day) was working in the yard with her daughter and saw poison ivy and thought she avoided it but didn't.  The rash is on her arms, abdomen, back, under breast and inguinal crease.  She is miserable with the rash and it is very itchy and uncomfortable.  She went to UC last Sunday, a week later, due to continued worsening rash and did receive solumedrol injection 120mg .  She states she was told if rash didn't improve in 48 hours to start the prednisone dose pack for 12 days.  She has triamcinolone she had been prescribed by dermatologist before that she did use but was advised by UC not to use any longer.    Asthma and allergic rhinitis are stable at this time.   Review of systems: Review of Systems  Constitutional: Negative.   HENT: Negative.    Eyes: Negative.   Respiratory: Negative.    Cardiovascular: Negative.   Gastrointestinal: Negative.   Musculoskeletal: Negative.   Skin:  Positive for rash.  Allergic/Immunologic: Negative.   Neurological: Negative.     All other systems negative unless noted above in HPI  Past medical/social/surgical/family history have been reviewed and are unchanged unless specifically indicated below.  No changes  Medication List: Current Outpatient Medications  Medication Sig Dispense Refill   albuterol (PROAIR HFA) 108 (90 Base) MCG/ACT inhaler Inhale 2 puffs every 4-6 hours if needed for cough or wheeze. 18 g 1   atorvastatin (LIPITOR) 20 MG tablet Take 20 mg by mouth daily.     AUVI-Q 0.3 MG/0.3ML SOAJ injection Use as directed for life-threatening allergic reaction      Budeson-Glycopyrrol-Formoterol (BREZTRI AEROSPHERE) 160-9-4.8 MCG/ACT AERO Inhale 2 puffs into the lungs in the morning and at bedtime. 10.7 g 11   clobetasol ointment (TEMOVATE) 0.05 % Apply 1 application. topically 2 (two) times daily. 60 g 0   doxycycline (PERIOSTAT) 20 MG tablet Take 20 mg by mouth 2 (two) times daily.     fexofenadine (ALLEGRA) 180 MG tablet Take 180 mg by mouth daily as needed for allergies or rhinitis.     finasteride (PROSCAR) 5 MG tablet      levothyroxine (SYNTHROID, LEVOTHROID) 75 MCG tablet      montelukast (SINGULAIR) 10 MG tablet Take 1 tablet (10 mg total) by mouth daily. 30 tablet 11   NON FORMULARY CPAP nightly     valACYclovir (VALTREX) 1000 MG tablet Take 4,000 mg by mouth once.     venlafaxine XR (EFFEXOR-XR) 75 MG 24 hr capsule      Vitamin D, Ergocalciferol, (DRISDOL) 1.25 MG (50000 UT) CAPS capsule      VYVANSE 30 MG capsule Take 30 mg by mouth daily as needed.     XOLAIR 150 MG injection INJECT 300 MG SUBCUTANEOUSLY EVERY 4 WEEKS. 2 each 11   Current Facility-Administered Medications  Medication Dose Route Frequency Provider Last Rate Last Admin   omalizumab ) injection 300 mg  300 mg Subcutaneous Q28 days Kozlow, Geoffry Paradise, MD   300 mg at 03/01/20 1524   omalizumab 04/29/20) prefilled syringe 300 mg  300 mg Subcutaneous Q28 days Jessica Priest, MD   300 mg at 06/27/21 1508     Known medication allergies: Allergies  Allergen Reactions   Ciprofloxacin Other (See Comments)    Joint pain   Codeine Other (See Comments)    NERVOUSNESS   Keflex [Cephalexin] Hives   Penicillins Rash   Sulfa Antibiotics Rash     Physical examination: Blood pressure 124/82, pulse 100, resp. rate 16, SpO2 97 %.  General: Alert, interactive, in no acute distress. HEENT: PERRLA, TMs pearly gray, turbinates non-edematous without discharge, post-pharynx non erythematous. Neck: Supple without lymphadenopathy. Lungs: Clear to auscultation without wheezing,  rhonchi or rales. {no increased work of breathing. CV: Normal S1, S2 without murmurs. Abdomen: Nondistended, nontender. Skin: Very erythematous patches to plaques with some satellite papules over the forearms b/l and lt antecubital fossa, under left breast, across abdomen extending the flanks; some areas with excoriations.  No blistering lesions . Extremities:  No clubbing, cyanosis or edema. Neuro:   Grossly intact.  Diagnositics/Labs: None today  Assessment and plan: Poison ivy/oak/sumac dermatitis - continued symptoms of rash not improving with steroid injection - advised to take high dose antihistamine regime over next 15 days: prednisone 40 mg for five days, 20 mg for five days, 10 mg for five days and stop.   - can use Zyrtec 1-2 tabs a day for itching - can use clobetasol twice a day as needed for itchy, rash on body - has used Tecnu multiple showering's to help decrease spread of plant oils causing symptoms  Continue current asthma/allergy regimen as per Dr. Lucie Leather recommendations  Follow up with Dr Lucie Leather as previously directed  I appreciate the opportunity to take part in Bliss's care. Please do not hesitate to contact me with questions.  Sincerely,   Margo Aye, MD Allergy/Immunology [facility]

## 2021-07-16 NOTE — Patient Instructions (Addendum)
Poison ivy/oak/sumac dermatitis - continued symptoms of rash not improving with steroid injection - advised to take high dose antihistamine regime over next 15 days: prednisone 40 mg for five days, 20 mg for five days, 10 mg for five days and stop.   - can use Zyrtec 1-2 tabs a day for itching - can use clobetasol twice a day as needed for itchy, rash on body - has used Tecnu multiple showering's to help decrease spread of plant oils causing symptoms  Continue current asthma/allergy regimen as per Dr. Lucie Leather recommendations  Follow up with Dr Lucie Leather as previously directed

## 2021-07-16 NOTE — Telephone Encounter (Signed)
Patient called stating she was seen at urgent care Sunday due to a rash. She was working in her yard over a week ago and she thinks she got into some poison ivy.   Patient was prescribed prednisone 5mg  for 12 days.  She states she is still has a bad rash and it's not getting any better. Patient would like to know if she needs to be seen today by Dr. or if she just needs to wait for the prednisone to work or if something else can be added to help her with the rash.  She wanted me to check with you before scheduling appointment    Urgent care visit can be seen through care everywhere.   Please advice. Thank you.

## 2021-07-23 ENCOUNTER — Telehealth: Payer: Self-pay | Admitting: Allergy

## 2021-07-23 NOTE — Telephone Encounter (Signed)
Left voicemail for patient to return call.

## 2021-07-23 NOTE — Telephone Encounter (Signed)
Dr. Delorse Lek you saw patient last Tuesday. Please advice. Thank you.

## 2021-07-23 NOTE — Telephone Encounter (Signed)
Patient states she is still having hives all over her body and has spread to her neck and behind ears. She wants to know how long it takes for the Prednisone to take affect because she is not feeling any better. She is wondering if she needs to be seen in office again.

## 2021-07-24 NOTE — Telephone Encounter (Signed)
Spoke with [place] and let her know what Dr. Delorse Lek said. She is going to call Andrea Terrell, she is a current patient there. She wants to hold off on the topical until she sees if she is able to see them.

## 2021-07-25 ENCOUNTER — Encounter: Payer: Self-pay | Admitting: Allergy and Immunology

## 2021-07-25 ENCOUNTER — Ambulatory Visit: Payer: BC Managed Care – PPO | Admitting: Allergy and Immunology

## 2021-07-25 ENCOUNTER — Ambulatory Visit: Payer: BC Managed Care – PPO

## 2021-07-25 VITALS — BP 150/88 | Resp 16

## 2021-07-25 DIAGNOSIS — J3089 Other allergic rhinitis: Secondary | ICD-10-CM

## 2021-07-25 DIAGNOSIS — J4550 Severe persistent asthma, uncomplicated: Secondary | ICD-10-CM | POA: Diagnosis not present

## 2021-07-25 DIAGNOSIS — L255 Unspecified contact dermatitis due to plants, except food: Secondary | ICD-10-CM

## 2021-07-25 NOTE — Progress Notes (Signed)
[name]   Follow-up Note  Referring Provider: Paulina Fusi, MD Primary Provider: Paulina Fusi, MD Date of Office Visit: 07/25/2021  Subjective:   Andrea Terrell (DOB: 1965/06/06) is a 56 y.o. female who [facility] in re-evaluation of the following:  HPI: Andrea Terrell presents to this clinic in evaluation of poison ivy reaction.  I last saw her in this clinic on 25 October 2020 for severe persistent asthma and allergic rhinitis.    She was seen by Dr. Delorse Lek on 16 Jul 2021 with an episode of plant induced contact dermatitis.  Her history started around mid May at which point in time she developed a blistering dermatitis after working in the garden and she went to the urgent care center was given a shot of steroids and then subsequently saw Dr. Delorse Lek who treated her with systemic steroids aiming for a 15-day taper and gave her topical clobetasol.  She has progressed regarding her widespread red itchy areas across her torso and extremities although it does appear as though all of her blistering reactions have resolved.  Allergies as of 07/25/2021       Reactions   Ciprofloxacin Other (See Comments)   Joint pain   Codeine Other (See Comments)   NERVOUSNESS   Keflex [cephalexin] Hives   Penicillins Rash   Sulfa Antibiotics Rash        Medication List    albuterol 108 (90 Base) MCG/ACT inhaler Commonly known as: ProAir HFA Inhale 2 puffs every 4-6 hours if needed for cough or wheeze.   atorvastatin 20 MG tablet Commonly known as: LIPITOR Take 20 mg by mouth daily.   Auvi-Q 0.3 mg/0.3 mL Soaj injection Generic drug: EPINEPHrine Use as directed for life-threatening allergic reaction   Breztri Aerosphere 160-9-4.8 MCG/ACT Aero Generic drug: Budeson-Glycopyrrol-Formoterol Inhale 2 puffs into the lungs in the morning and at bedtime.   clobetasol ointment 0.05 % Commonly  known as: TEMOVATE Apply 1 application. topically 2 (two) times daily.   doxycycline 20 MG tablet Commonly known as: PERIOSTAT Take 20 mg by mouth 2 (two) times daily.   fexofenadine 180 MG tablet Commonly known as: ALLEGRA Take 180 mg by mouth daily as needed for allergies or rhinitis.   finasteride 5 MG tablet Commonly known as: PROSCAR   levothyroxine 75 MCG tablet Commonly known as: SYNTHROID   montelukast 10 MG tablet Commonly known as: SINGULAIR Take 1 tablet (10 mg total) by mouth daily.   NON FORMULARY CPAP nightly   valACYclovir 1000 MG tablet Commonly known as: VALTREX Take 4,000 mg by mouth once.   venlafaxine XR 75 MG 24 hr capsule Commonly known as: EFFEXOR-XR   Vitamin D (Ergocalciferol) 1.25 MG (50000 UNIT) Caps capsule Commonly known as: DRISDOL   Vyvanse 30 MG capsule Generic drug: lisdexamfetamine Take 30 mg by mouth daily as needed.   Xolair 150 MG injection Generic drug: omalizumab INJECT 300 MG SUBCUTANEOUSLY EVERY 4 WEEKS.      Past Medical History:  Diagnosis Date   Asthma    Hypothyroid    OSA (obstructive sleep apnea) 02/08/2016    Past Surgical History:  Procedure Laterality Date   ABDOMINAL HERNIA REPAIR     age 89   ADENOIDECTOMY     CERVICAL ABLATION  2006   FINGER SURGERY     age 18   TONSILLECTOMY     TUBAL LIGATION  1996  Review of systems negative except as noted in HPI / PMHx or noted below:  Review of Systems  Constitutional: Negative.   HENT: Negative.    Eyes: Negative.   Respiratory: Negative.    Cardiovascular: Negative.   Gastrointestinal: Negative.   Genitourinary: Negative.   Musculoskeletal: Negative.   Skin: Negative.   Neurological: Negative.   Endo/Heme/Allergies: Negative.   Psychiatric/Behavioral: Negative.      Objective:   Vitals:   07/25/21 1409  BP: (!) 150/88  Resp: 16          Physical Exam Constitutional:      Appearance: She is not diaphoretic.  HENT:     Head:  Normocephalic.     Right Ear: Tympanic membrane, ear canal and external ear normal.     Left Ear: Tympanic membrane, ear canal and external ear normal.     Nose: Nose normal. No mucosal edema or rhinorrhea.     Mouth/Throat:     Pharynx: Uvula midline. No oropharyngeal exudate.  Eyes:     Conjunctiva/sclera: Conjunctivae normal.  Neck:     Thyroid: No thyromegaly.     Trachea: Trachea normal. No tracheal tenderness or tracheal deviation.  Cardiovascular:     Rate and Rhythm: Normal rate and regular rhythm.     Heart sounds: Normal heart sounds, S1 normal and S2 normal. No murmur heard. Pulmonary:     Effort: No respiratory distress.     Breath sounds: Normal breath sounds. No stridor. No wheezing or rales.  Lymphadenopathy:     Head:     Right side of head: No tonsillar adenopathy.     Left side of head: No tonsillar adenopathy.     Cervical: No cervical adenopathy.  Skin:    Findings: No erythema or rash (Widespread blotchy somewhat indurated erythematous lesions across arms and legs and torso).     Nails: There is no clubbing.  Neurological:     Mental Status: She is alert.    Diagnostics: none   Assessment and Plan:   1. Dermatitis due to plants, including poison ivy, sumac, and oak   2. Asthma, severe persistent, well-controlled   3. Other allergic rhinitis     1. Continue Breztri - 2 inhalations 1-2 times per day  2. Continue Montelukast 10 mg tablet 1 time per day  3. Continue Xolair (and AUVI-Q 0.3)  4. If needed:   A. OTC Allegra 180 - 1 tablet 1-2 time per day  B. Proventil HFA or similar 2 inhalations every 4-6 hour  C. OTC Pataday - 1 drop each eye 1 time per day  5. Prednisone 10 mg - 1-1/2 tablet 2 times per day for 5 days, then 1 tablet 2 times per day for 5 days, then 1-1/2 tablet daily for 7 days, then 1 tablet daily x 7 days, then 1/2 tablet daily x 7 days  6. Continue clobetasol ointment  7. Return to clinic in 12 months or earlier if  problem.  It looks as though Andrea Terrell has developed a [employer] plant induced contact dermatitis with secondary Id reaction and we are going to need to treat her with prolonged systemic steroids as noted above.  I had a long talk with her today about some of the side effects of using these types of doses of systemic steroids and she needs to be cognizant about the side effects that may develop over the course the next several weeks.  Hopefully this very slow taper ofsystemic steroids will result  in resolution of this widespread T-cell mediated inflammatory reaction.  Assuming she does well with this plan I can see her back in this clinic in 12 months or earlier if there is a problem while she continues to utilize a collection of anti-inflammatory agents for her airway including the use of omalizumab.  Laurette SchimkeEric Dalon Reichart, MD Allergy / Immunology [facility]

## 2021-07-25 NOTE — Patient Instructions (Addendum)
  1. Continue Breztri - 2 inhalations 1-2 times per day  2. Continue Montelukast 10 mg tablet 1 time per day  3. Continue Xolair (and AUVI-Q 0.3)  4. If needed:   A. OTC Allegra 180 - 1 tablet 1-2 time per day  B. Proventil HFA or similar 2 inhalations every 4-6 hour  C. OTC Pataday - 1 drop each eye 1 time per day  5. Prednisone 10 mg - 1-1/2 tablet 2 times per day for 5 days, then 1 tablet 2 times per day for 5 days, then 1-1/2 tablet daily for 7 days, then 1 tablet daily x 7 days, then 1/2 tablet daily x 7 days  6. Continue clobetasol ointment  7. Return to clinic in 12 months or earlier if problem.

## 2021-07-29 ENCOUNTER — Encounter: Payer: Self-pay | Admitting: Allergy and Immunology

## 2021-08-02 ENCOUNTER — Telehealth: Payer: Self-pay | Admitting: Allergy and Immunology

## 2021-08-02 DIAGNOSIS — L255 Unspecified contact dermatitis due to plants, except food: Secondary | ICD-10-CM | POA: Diagnosis not present

## 2021-08-02 DIAGNOSIS — E039 Hypothyroidism, unspecified: Secondary | ICD-10-CM | POA: Diagnosis not present

## 2021-08-02 DIAGNOSIS — E538 Deficiency of other specified B group vitamins: Secondary | ICD-10-CM | POA: Diagnosis not present

## 2021-08-02 MED ORDER — MONTELUKAST SODIUM 10 MG PO TABS: 10.0000 mg | ORAL_TABLET | Freq: Every day | ORAL | 5 refills | Status: DC

## 2021-08-02 NOTE — Telephone Encounter (Signed)
Patient called office because she ran out of montelukast. She forgot to mention it when she was seen on 07/25/2021.  Will sent refill as requested.

## 2021-08-09 DIAGNOSIS — F411 Generalized anxiety disorder: Secondary | ICD-10-CM | POA: Diagnosis not present

## 2021-08-09 DIAGNOSIS — F332 Major depressive disorder, recurrent severe without psychotic features: Secondary | ICD-10-CM | POA: Diagnosis not present

## 2021-08-09 DIAGNOSIS — F5081 Binge eating disorder: Secondary | ICD-10-CM | POA: Diagnosis not present

## 2021-08-22 ENCOUNTER — Ambulatory Visit (INDEPENDENT_AMBULATORY_CARE_PROVIDER_SITE_OTHER): Payer: BC Managed Care – PPO | Admitting: *Deleted

## 2021-08-22 DIAGNOSIS — J4550 Severe persistent asthma, uncomplicated: Secondary | ICD-10-CM | POA: Diagnosis not present

## 2021-08-26 DIAGNOSIS — H35411 Lattice degeneration of retina, right eye: Secondary | ICD-10-CM | POA: Diagnosis not present

## 2021-08-26 DIAGNOSIS — H18832 Recurrent erosion of cornea, left eye: Secondary | ICD-10-CM | POA: Diagnosis not present

## 2021-08-26 DIAGNOSIS — H04123 Dry eye syndrome of bilateral lacrimal glands: Secondary | ICD-10-CM | POA: Diagnosis not present

## 2021-08-26 DIAGNOSIS — H25813 Combined forms of age-related cataract, bilateral: Secondary | ICD-10-CM | POA: Diagnosis not present

## 2021-09-19 ENCOUNTER — Ambulatory Visit (INDEPENDENT_AMBULATORY_CARE_PROVIDER_SITE_OTHER): Payer: BC Managed Care – PPO | Admitting: *Deleted

## 2021-09-19 DIAGNOSIS — J4550 Severe persistent asthma, uncomplicated: Secondary | ICD-10-CM

## 2021-09-20 DIAGNOSIS — Z0000 Encounter for general adult medical examination without abnormal findings: Secondary | ICD-10-CM | POA: Diagnosis not present

## 2021-10-04 ENCOUNTER — Encounter: Payer: Self-pay | Admitting: Pulmonary Disease

## 2021-10-04 ENCOUNTER — Ambulatory Visit: Payer: BC Managed Care – PPO | Admitting: Pulmonary Disease

## 2021-10-04 VITALS — BP 124/68 | HR 99 | Ht 65.0 in | Wt 244.8 lb

## 2021-10-04 DIAGNOSIS — G4733 Obstructive sleep apnea (adult) (pediatric): Secondary | ICD-10-CM | POA: Diagnosis not present

## 2021-10-04 DIAGNOSIS — J4550 Severe persistent asthma, uncomplicated: Secondary | ICD-10-CM

## 2021-10-04 MED ORDER — BREZTRI AEROSPHERE 160-9-4.8 MCG/ACT IN AERO: 2.0000 | INHALATION_SPRAY | Freq: Two times a day (BID) | RESPIRATORY_TRACT | 11 refills | Status: DC

## 2021-10-04 NOTE — Progress Notes (Signed)
@Patient  ID: , female    DOB: 1965/12/28, 56 y.o.   MRN: [id number]  Chief Complaint  Patient presents with   Follow-up    Pt is here for follow up for asthma. Pt states that she is doing well this office visit. Pt states that she is still on xolair injections once a month. Pt is on Breztri daily and albuterol as needed. Pt states inhalers are working well for her with no issues noted.     Referring provider: [id number], MD  HPI:   56 y.o. woman whom we are seeing in follow-up for dyspnea on exertion and asthma.  Asthma previously well controlled on Xolair.  Worsened symptoms over the summer/fall 2022.  Fortunately great improvement switching Breo/Spiriva to 2023.  TTE obtained which is reassuring.  Return for routine follow up. Doing ok.  Rare albuterol use if ever.  Last month With poison ivy rash and had a subsequent systemic reaction.  Seen by Dr. [employer] her allergy and immunologist.  Treated with a prolonged course of steroid.  Eventually cleared up.  HPI initial visit: Patient was in normal state of health about 3 months ago.  Relatively abrupt onset of significant dyspnea on exertion.  Very winded with steps, inclines.  Winded on flat surfaces as well.  No clear inciting event.  She denies viral infection or other infection, trauma etc.  She has been seen by PCP.  Also seen by allergy and immunology.  Received 2 courses of prednisone, course of doxycycline.  She thinks these provided mild intermittent relief but quickly returned to baseline worsening dyspnea shortly after completion.  She reports adherence to high-dose Breo, montelukast, Xolair injections every month.  She is on these injections for many years.  In some ways seems similar to prior asthma exacerbations but in other ways different.  She has CT abdomen pelvis 2019 with no comment on abnormality in the lower part of the lungs.  PMH: Asthma, seasonal allergies, Surgical history: Hernia repair,  adenoidectomy, tonsillectomy, tubal ligation Family history: Mother COPD, CAD, father with hypertension Social history: Minimal smoking history, approximate 2 pack years while in college, works at [employer] at [employer] sport and exercise, lives in [place]   Questionaires / Pulmonary Flowsheets:   ACT:  Asthma Control Test ACT Total Score  10/04/2021  9:01 AM 22  04/05/2021  9:03 AM 21  01/04/2021  9:27 AM 22    MMRC:     No data to display          Epworth:     01/04/2016    1:00 PM  Results of the Epworth flowsheet  Sitting and reading 0  Watching TV 0  Sitting, inactive in a public place (e.g. a theatre or a meeting) 0  As a passenger in a car for an hour without a break 2  Lying down to rest in the afternoon when circumstances permit 1  Sitting and talking to someone 0  Sitting quietly after a lunch without alcohol 0  In a car, while stopped for a few minutes in traffic 0  Total score 3    Tests:   FENO:  No results found for: "NITRICOXIDE"  PFT:     No data to display          WALK:      No data to display          Imaging: Personally reviewed as per EMR discussion this note   Lab Results: Personally reviewed and  as per EMR CBC No results found for: "WBC", "RBC", "HGB", "HCT", "PLT", "MCV", "MCH", "MCHC", "RDW", "LYMPHSABS", "MONOABS", "EOSABS", "BASOSABS"  BMET No results found for: "NA", "K", "CL", "CO2", "GLUCOSE", "BUN", "CREATININE", "CALCIUM", "GFRNONAA", "GFRAA"  BNP No results found for: "BNP"  ProBNP No results found for: "PROBNP"  Specialty Problems       Pulmonary Problems   Severe persistent asthma   OSA (obstructive sleep apnea)    Allergies  Allergen Reactions   Ciprofloxacin Other (See Comments)    Joint pain   Codeine Other (See Comments)    NERVOUSNESS   Keflex [Cephalexin] Hives   Penicillins Rash   Sulfa Antibiotics Rash    Immunization History  Administered Date(s) Administered   Influenza Inj Mdck Quad Pf  11/21/2016, 11/26/2018   Influenza Split 12/05/2009, 11/18/2011, 11/05/2012, 11/25/2013   Influenza,inj,Quad PF,6+ Mos 10/26/2015, 11/19/2017, 01/04/2021   Influenza-Unspecified 10/26/2015   Moderna Sars-Covid-2 Vaccination 04/07/2019, 05/02/2019   Pneumococcal Polysaccharide-23 07/16/2012    Past Medical History:  Diagnosis Date   Asthma    Hypothyroid    OSA (obstructive sleep apnea) 02/08/2016    Tobacco History: Social History   Tobacco Use  Smoking Status Former   Packs/day: 0.50   Years: 5.00   Total pack years: 2.50   Types: Cigarettes   Quit date: 1989   Years since quitting: 34.6  Smokeless Tobacco Never   Counseling given: Not Answered    Continue to not smoke  Outpatient Encounter Medications as of 10/04/2021  Medication Sig   albuterol (PROAIR HFA) 108 (90 Base) MCG/ACT inhaler Inhale 2 puffs every 4-6 hours if needed for cough or wheeze.   atorvastatin (LIPITOR) 20 MG tablet Take 20 mg by mouth daily.   AUVI-Q 0.3 MG/0.3ML SOAJ injection Use as directed for life-threatening allergic reaction   fexofenadine (ALLEGRA) 180 MG tablet Take 180 mg by mouth daily as needed for allergies or rhinitis.   levothyroxine (SYNTHROID, LEVOTHROID) 75 MCG tablet    montelukast (SINGULAIR) 10 MG tablet Take 1 tablet (10 mg total) by mouth daily.   NON FORMULARY CPAP nightly   venlafaxine XR (EFFEXOR-XR) 75 MG 24 hr capsule    Vitamin D, Ergocalciferol, (DRISDOL) 1.25 MG (50000 UT) CAPS capsule    VYVANSE 30 MG capsule Take 30 mg by mouth daily as needed.   XOLAIR 150 MG injection INJECT 300 MG SUBCUTANEOUSLY EVERY 4 WEEKS.   [DISCONTINUED] Budeson-Glycopyrrol-Formoterol (BREZTRI AEROSPHERE) 160-9-4.8 MCG/ACT AERO Inhale 2 puffs into the lungs in the morning and at bedtime.   Budeson-Glycopyrrol-Formoterol (BREZTRI AEROSPHERE) 160-9-4.8 MCG/ACT AERO Inhale 2 puffs into the lungs in the morning and at bedtime.   [DISCONTINUED] clobetasol ointment (TEMOVATE) 0.05 % Apply 1  application. topically 2 (two) times daily.   [DISCONTINUED] doxycycline (PERIOSTAT) 20 MG tablet Take 20 mg by mouth 2 (two) times daily.   [DISCONTINUED] finasteride (PROSCAR) 5 MG tablet    [DISCONTINUED] valACYclovir (VALTREX) 1000 MG tablet Take 4,000 mg by mouth once.   Facility-Administered Encounter Medications as of 10/04/2021  Medication   omalizumab Geoffry Paradise) injection 300 mg   omalizumab Geoffry Paradise) prefilled syringe 300 mg     Review of Systems  Review of Systems  N/a  Physical Exam  BP 124/68 (BP Location: Left Arm, Patient Position: Sitting, Cuff Size: Normal)   Pulse 99   Ht 5\' 5"  (1.651 m)   Wt 244 lb 12.8 oz (111 kg)   SpO2 97%   BMI 40.74 kg/m   Wt Readings from Last 5 Encounters:  10/04/21 244 lb 12.8 oz (111 kg)  04/05/21 223 lb 6.4 oz (101.3 kg)  01/04/21 221 lb 6.4 oz (100.4 kg)  11/06/20 228 lb 6.4 oz (103.6 kg)  07/19/20 228 lb (103.4 kg)    BMI Readings from Last 5 Encounters:  10/04/21 40.74 kg/m  04/05/21 37.18 kg/m  01/04/21 36.84 kg/m  11/06/20 38.01 kg/m  07/19/20 37.59 kg/m     Physical Exam General: In no distress, sitting in chair Eyes: EOMI, no icterus Neck: Supple, no JVP Pulmonary: Clear, no wheeze, normal work of breathing Cardiovascular: Regular rate, regular rhythm, no murmur MSK: No synovitis, joint effusion Neuro: Normal gait, no weakness Psych: Normal mood, full affect   Assessment & Plan:   Dyspnea on exertion: Initially etiology unclear, felt to be due to uncontrolled asthma that improved with changing inhaler therapy from Breo/Spiriva to [place] HFA.  Chest imaging clear.  TTE reassuring, only grade 1 diastolic dysfunction.    Severe persistent asthma in remission: As of late worsening symptoms despite Xolair.  Repeat IgE within normal limits.  Symptoms improved with change to Breztri from high-dose Breo/Spiriva.  Rhinosinusitis: Related to allergic type symptoms.  She has good understanding of using  antihistamines, etc.  OSA: Good adherence to CPAP.  To continue.  Return in about 6 months (around 04/06/2022).   Lanier Clam, MD 10/04/2021

## 2021-10-04 NOTE — Patient Instructions (Addendum)
Nice to see you again. No changes to medications.   I refilled the Breztri today.  Return to clinic in 6 months or sooner as needed

## 2021-10-09 ENCOUNTER — Other Ambulatory Visit (HOSPITAL_COMMUNITY): Payer: Self-pay

## 2021-10-09 ENCOUNTER — Telehealth: Payer: Self-pay

## 2021-10-09 NOTE — Telephone Encounter (Signed)
Patient Advocate Encounter   Received notification that prior authorization for Breztri Aerosphere 160-9-4.8MCG/ACT aerosol is required.   PA submitted on 10/09/2021 Key : [id number]  Status is pending

## 2021-10-17 ENCOUNTER — Ambulatory Visit (INDEPENDENT_AMBULATORY_CARE_PROVIDER_SITE_OTHER): Payer: BC Managed Care – PPO | Admitting: *Deleted

## 2021-10-17 DIAGNOSIS — J4550 Severe persistent asthma, uncomplicated: Secondary | ICD-10-CM

## 2021-10-18 ENCOUNTER — Other Ambulatory Visit (HOSPITAL_COMMUNITY): Payer: Self-pay

## 2021-10-18 NOTE — Telephone Encounter (Signed)
Patient Advocate Encounter  Received a fax regarding Prior Authorization for [employer].   Authorization has been CANCELLED due to prior authorization not required. This medication was filled at [facility] on 09/28/2021.  Burnell Blanks, CPhT Rx Patient Advocate Phone: [phone]

## 2021-10-25 ENCOUNTER — Other Ambulatory Visit: Payer: Self-pay | Admitting: Allergy and Immunology

## 2021-11-01 DIAGNOSIS — F411 Generalized anxiety disorder: Secondary | ICD-10-CM | POA: Diagnosis not present

## 2021-11-01 DIAGNOSIS — F5081 Binge eating disorder: Secondary | ICD-10-CM | POA: Diagnosis not present

## 2021-11-01 DIAGNOSIS — F332 Major depressive disorder, recurrent severe without psychotic features: Secondary | ICD-10-CM | POA: Diagnosis not present

## 2021-11-14 ENCOUNTER — Ambulatory Visit (INDEPENDENT_AMBULATORY_CARE_PROVIDER_SITE_OTHER): Payer: BC Managed Care – PPO

## 2021-11-14 DIAGNOSIS — J4550 Severe persistent asthma, uncomplicated: Secondary | ICD-10-CM | POA: Diagnosis not present

## 2021-12-12 ENCOUNTER — Other Ambulatory Visit: Payer: Self-pay | Admitting: *Deleted

## 2021-12-12 ENCOUNTER — Ambulatory Visit (INDEPENDENT_AMBULATORY_CARE_PROVIDER_SITE_OTHER): Payer: BC Managed Care – PPO | Admitting: *Deleted

## 2021-12-12 DIAGNOSIS — J4550 Severe persistent asthma, uncomplicated: Secondary | ICD-10-CM | POA: Diagnosis not present

## 2021-12-12 MED ORDER — MONTELUKAST SODIUM 10 MG PO TABS: 10.0000 mg | ORAL_TABLET | Freq: Every day | ORAL | 7 refills | Status: DC

## 2021-12-25 ENCOUNTER — Other Ambulatory Visit: Payer: Self-pay | Admitting: *Deleted

## 2021-12-25 ENCOUNTER — Other Ambulatory Visit (HOSPITAL_COMMUNITY): Payer: Self-pay

## 2021-12-25 MED ORDER — OMALIZUMAB 150 MG/ML ~~LOC~~ SOSY
300.0000 mg | PREFILLED_SYRINGE | SUBCUTANEOUS | 11 refills | Status: DC
  Filled 2021-12-25: qty 2, 28d supply, fill #0
  Filled 2022-01-27: qty 2, 28d supply, fill #1
  Filled 2022-02-21: qty 2, 28d supply, fill #2
  Filled 2022-03-19: qty 2, 28d supply, fill #3
  Filled 2022-04-24: qty 2, 28d supply, fill #4

## 2021-12-25 NOTE — Telephone Encounter (Signed)
L/m for patient advising change from [facility] to [facility] due to Ins

## 2021-12-29 DIAGNOSIS — R051 Acute cough: Secondary | ICD-10-CM | POA: Diagnosis not present

## 2021-12-29 DIAGNOSIS — R062 Wheezing: Secondary | ICD-10-CM | POA: Diagnosis not present

## 2022-01-03 ENCOUNTER — Other Ambulatory Visit (HOSPITAL_COMMUNITY): Payer: Self-pay

## 2022-01-06 ENCOUNTER — Other Ambulatory Visit (HOSPITAL_COMMUNITY): Payer: Self-pay

## 2022-01-09 ENCOUNTER — Ambulatory Visit (INDEPENDENT_AMBULATORY_CARE_PROVIDER_SITE_OTHER): Payer: BC Managed Care – PPO | Admitting: *Deleted

## 2022-01-09 DIAGNOSIS — J4550 Severe persistent asthma, uncomplicated: Secondary | ICD-10-CM

## 2022-01-27 ENCOUNTER — Other Ambulatory Visit (HOSPITAL_COMMUNITY): Payer: Self-pay

## 2022-01-30 ENCOUNTER — Other Ambulatory Visit (HOSPITAL_COMMUNITY): Payer: Self-pay

## 2022-01-31 ENCOUNTER — Other Ambulatory Visit (HOSPITAL_COMMUNITY): Payer: Self-pay

## 2022-02-06 ENCOUNTER — Ambulatory Visit (INDEPENDENT_AMBULATORY_CARE_PROVIDER_SITE_OTHER): Payer: BC Managed Care – PPO | Admitting: *Deleted

## 2022-02-06 DIAGNOSIS — J4550 Severe persistent asthma, uncomplicated: Secondary | ICD-10-CM

## 2022-02-14 DIAGNOSIS — F332 Major depressive disorder, recurrent severe without psychotic features: Secondary | ICD-10-CM | POA: Diagnosis not present

## 2022-02-14 DIAGNOSIS — F5081 Binge eating disorder: Secondary | ICD-10-CM | POA: Diagnosis not present

## 2022-02-14 DIAGNOSIS — F411 Generalized anxiety disorder: Secondary | ICD-10-CM | POA: Diagnosis not present

## 2022-02-21 ENCOUNTER — Other Ambulatory Visit (HOSPITAL_COMMUNITY): Payer: Self-pay

## 2022-02-25 ENCOUNTER — Other Ambulatory Visit: Payer: Self-pay

## 2022-02-26 ENCOUNTER — Other Ambulatory Visit (HOSPITAL_COMMUNITY): Payer: Self-pay

## 2022-03-06 ENCOUNTER — Ambulatory Visit: Payer: BC Managed Care – PPO

## 2022-03-10 ENCOUNTER — Ambulatory Visit (INDEPENDENT_AMBULATORY_CARE_PROVIDER_SITE_OTHER): Payer: BC Managed Care – PPO

## 2022-03-10 DIAGNOSIS — J4550 Severe persistent asthma, uncomplicated: Secondary | ICD-10-CM

## 2022-03-19 ENCOUNTER — Other Ambulatory Visit (HOSPITAL_COMMUNITY): Payer: Self-pay

## 2022-03-31 ENCOUNTER — Other Ambulatory Visit (HOSPITAL_COMMUNITY): Payer: Self-pay

## 2022-04-01 ENCOUNTER — Other Ambulatory Visit: Payer: Self-pay

## 2022-04-07 ENCOUNTER — Ambulatory Visit: Payer: BC Managed Care – PPO

## 2022-04-10 ENCOUNTER — Ambulatory Visit (INDEPENDENT_AMBULATORY_CARE_PROVIDER_SITE_OTHER): Payer: BC Managed Care – PPO | Admitting: *Deleted

## 2022-04-10 DIAGNOSIS — J4550 Severe persistent asthma, uncomplicated: Secondary | ICD-10-CM | POA: Diagnosis not present

## 2022-04-24 ENCOUNTER — Other Ambulatory Visit (HOSPITAL_COMMUNITY): Payer: Self-pay

## 2022-04-28 ENCOUNTER — Other Ambulatory Visit (HOSPITAL_COMMUNITY): Payer: Self-pay

## 2022-04-29 ENCOUNTER — Other Ambulatory Visit (HOSPITAL_COMMUNITY): Payer: Self-pay

## 2022-05-08 ENCOUNTER — Ambulatory Visit (INDEPENDENT_AMBULATORY_CARE_PROVIDER_SITE_OTHER): Payer: BC Managed Care – PPO | Admitting: *Deleted

## 2022-05-08 DIAGNOSIS — J4550 Severe persistent asthma, uncomplicated: Secondary | ICD-10-CM

## 2022-05-09 ENCOUNTER — Encounter: Payer: Self-pay | Admitting: Pulmonary Disease

## 2022-05-09 ENCOUNTER — Ambulatory Visit: Payer: BC Managed Care – PPO | Admitting: Pulmonary Disease

## 2022-05-09 DIAGNOSIS — J4550 Severe persistent asthma, uncomplicated: Secondary | ICD-10-CM | POA: Diagnosis not present

## 2022-05-09 MED ORDER — BREZTRI AEROSPHERE 160-9-4.8 MCG/ACT IN AERO: 2.0000 | INHALATION_SPRAY | Freq: Two times a day (BID) | RESPIRATORY_TRACT | 11 refills | Status: DC

## 2022-05-09 MED ORDER — AZITHROMYCIN 250 MG PO TABS: 250.0000 mg | ORAL_TABLET | ORAL | 0 refills | Status: DC

## 2022-05-09 MED ORDER — AZITHROMYCIN 250 MG PO TABS: 250.0000 mg | ORAL_TABLET | ORAL | 1 refills | Status: DC

## 2022-05-09 MED ORDER — ALBUTEROL SULFATE HFA 108 (90 BASE) MCG/ACT IN AERS: INHALATION_SPRAY | RESPIRATORY_TRACT | 1 refills | Status: DC

## 2022-05-09 NOTE — Progress Notes (Signed)
@Patient  ID: Andrea Terrell, female    DOB: [birth year], 57 y.o.   MRN: [id number]  Chief Complaint  Patient presents with   Follow-up    Pt is here for follow up for asthma. Pt is on Breztri daily. Pt states she got sick in November and was given prednisone. Pt states she is having to use her albuterol as little bit more then she is used to. Some chest tightness that is noted more often.     Referring provider: Nicoletta Dress, MD  HPI:   57 y.o. woman whom we are seeing in follow-up for dyspnea on exertion and asthma.  Asthma previously well controlled on Xolair.  Worsened symptoms over the summer/fall 2022.  Fortunately great improvement switching Breo/Spiriva to [employer].  TTE obtained which is reassuring.  Return for routine follow up. Doing ok.  Contracted with sound like viral illness fall 2023.  Had a lot of congestion.  Treated with round of prednisone.  Since then, residual congestion in chest worse than baseline.  Some cough with white phlegm.  Worse than normal.  Discussed he is on maximal inhaler therapy as well as biologic for asthma.  Not a lot to add.  Discussed brief round of prednisone to try congestion.  She would like to or prefers avoiding prednisone.  Discussed using azithromycin for anti-inflammatory properties.  HPI initial visit: Patient was in normal state of health about 3 months ago.  Relatively abrupt onset of significant dyspnea on exertion.  Very winded with steps, inclines.  Winded on flat surfaces as well.  No clear inciting event.  She denies viral infection or other infection, trauma etc.  She has been seen by PCP.  Also seen by allergy and immunology.  Received 2 courses of prednisone, course of doxycycline.  She thinks these provided mild intermittent relief but quickly returned to baseline worsening dyspnea shortly after completion.  She reports adherence to high-dose Breo, montelukast, Xolair injections every month.  She is on these injections for many  years.  In some ways seems similar to prior asthma exacerbations but in other ways different.  She has CT abdomen pelvis 2019 with no comment on abnormality in the lower part of the lungs.  PMH: Asthma, seasonal allergies, Surgical history: Hernia repair, adenoidectomy, tonsillectomy, tubal ligation Family history: Mother COPD, CAD, father with hypertension Social history: Minimal smoking history, approximate 2 pack years while in college, works at Engineer, [employer] at [employer], lives in [place] / Pulmonary Flowsheets:   ACT:  Asthma Control Test ACT Total Score  10/04/2021  9:01 AM 22  04/05/2021  9:03 AM 21  01/04/2021  9:27 AM 22    MMRC:     No data to display           Epworth:     01/04/2016    1:00 PM  Results of the Epworth flowsheet  Sitting and reading 0  Watching TV 0  Sitting, inactive in a public place (e.g. a theatre or a meeting) 0  As a passenger in a car for an hour without a break 2  Lying down to rest in the afternoon when circumstances permit 1  Sitting and talking to someone 0  Sitting quietly after a lunch without alcohol 0  In a car, while stopped for a few minutes in traffic 0  Total score 3    Tests:   FENO:  No results found for: "NITRICOXIDE"  PFT:     No data  to display           WALK:      No data to display           Imaging: Personally reviewed as per EMR discussion this note   Lab Results: Personally reviewed and as per EMR CBC No results found for: "WBC", "RBC", "HGB", "HCT", "PLT", "MCV", "MCH", "MCHC", "RDW", "LYMPHSABS", "MONOABS", "EOSABS", "BASOSABS"  BMET No results found for: "NA", "K", "CL", "CO2", "GLUCOSE", "BUN", "CREATININE", "CALCIUM", "GFRNONAA", "GFRAA"  BNP No results found for: "BNP"  ProBNP No results found for: "PROBNP"  Specialty Problems       Pulmonary Problems   Severe persistent asthma   OSA (obstructive sleep apnea)    Allergies  Allergen Reactions    Ciprofloxacin Other (See Comments)    Joint pain   Codeine Other (See Comments)    NERVOUSNESS   Keflex [Cephalexin] Hives   Penicillins Rash   Sulfa Antibiotics Rash    Immunization History  Administered Date(s) Administered   Influenza Inj Mdck Quad Pf 11/21/2016, 11/26/2018   Influenza Split 12/05/2009, 11/18/2011, 11/05/2012, 11/25/2013   Influenza,inj,Quad PF,6+ Mos 10/26/2015, 11/19/2017, 01/04/2021   Influenza-Unspecified 10/26/2015   Moderna Sars-Covid-2 Vaccination 04/07/2019, 05/02/2019   Pneumococcal Polysaccharide-23 07/16/2012    Past Medical History:  Diagnosis Date   Asthma    Hypothyroid    OSA (obstructive sleep apnea) 02/08/2016    Tobacco History: Social History   Tobacco Use  Smoking Status Former   Packs/day: 0.50   Years: 5.00   Additional pack years: 0.00   Total pack years: 2.50   Types: Cigarettes   Quit date: 1989   Years since quitting: 35.2  Smokeless Tobacco Never   Counseling given: Not Answered    Continue to not smoke  Outpatient Encounter Medications as of 05/09/2022  Medication Sig   atorvastatin (LIPITOR) 20 MG tablet Take 20 mg by mouth daily.   AUVI-Q 0.3 MG/0.3ML SOAJ injection Use as directed for life-threatening allergic reaction   fexofenadine (ALLEGRA) 180 MG tablet Take 180 mg by mouth daily as needed for allergies or rhinitis.   levothyroxine (SYNTHROID, LEVOTHROID) 75 MCG tablet    montelukast (SINGULAIR) 10 MG tablet Take 1 tablet (10 mg total) by mouth daily.   NON FORMULARY CPAP nightly   venlafaxine XR (EFFEXOR-XR) 75 MG 24 hr capsule    Vitamin D, Ergocalciferol, (DRISDOL) 1.25 MG (50000 UT) CAPS capsule    VYVANSE 30 MG capsule Take 30 mg by mouth daily as needed.   XOLAIR 150 MG injection INJECT 300 MG SUBCUTANEOUSLY EVERY 4 WEEKS.   [DISCONTINUED] albuterol (PROAIR HFA) 108 (90 Base) MCG/ACT inhaler Inhale 2 puffs every 4-6 hours if needed for cough or wheeze.   [DISCONTINUED] azithromycin (ZITHROMAX)  250 MG tablet Take 1 tablet (250 mg total) by mouth 3 (three) times a week. Monday, Wednesday, friday   [DISCONTINUED] Budeson-Glycopyrrol-Formoterol (BREZTRI AEROSPHERE) 160-9-4.8 MCG/ACT AERO Inhale 2 puffs into the lungs in the morning and at bedtime.   albuterol (PROAIR HFA) 108 (90 Base) MCG/ACT inhaler Inhale 2 puffs every 4-6 hours if needed for cough or wheeze.   azithromycin (ZITHROMAX) 250 MG tablet Take 1 tablet (250 mg total) by mouth 3 (three) times a week. Monday, Wednesday, friday   Budeson-Glycopyrrol-Formoterol (BREZTRI AEROSPHERE) 160-9-4.8 MCG/ACT AERO Inhale 2 puffs into the lungs in the morning and at bedtime.   [DISCONTINUED] omalizumab Arvid Right) 150 MG/ML prefilled syringe Inject 300 mg into the skin every 28 (twenty-eight) days.   [DISCONTINUED] Arvid Right  150 MG/ML prefilled syringe INJECT 300 MG UNDER THE SKIN EVERY 4 WEEKS   Facility-Administered Encounter Medications as of 05/09/2022  Medication   omalizumab Arvid Right) injection 300 mg     Review of Systems  Review of Systems  N/a  Physical Exam  BP 128/72 (BP Location: Left Arm, Patient Position: Sitting, Cuff Size: Normal)   Pulse (!) 104   Wt 247 lb 3.2 oz (112.1 kg)   SpO2 96%   BMI 41.14 kg/m   Wt Readings from Last 5 Encounters:  05/09/22 247 lb 3.2 oz (112.1 kg)  10/04/21 244 lb 12.8 oz (111 kg)  04/05/21 223 lb 6.4 oz (101.3 kg)  01/04/21 221 lb 6.4 oz (100.4 kg)  11/06/20 228 lb 6.4 oz (103.6 kg)    BMI Readings from Last 5 Encounters:  05/09/22 41.14 kg/m  10/04/21 40.74 kg/m  04/05/21 37.18 kg/m  01/04/21 36.84 kg/m  11/06/20 38.01 kg/m     Physical Exam General: In no distress, sitting in chair Eyes: EOMI, no icterus Neck: Supple, no JVP Pulmonary: Clear, no wheeze, normal work of breathing Cardiovascular: Regular rate, regular rhythm, no murmur MSK: No synovitis, joint effusion Neuro: Normal gait, no weakness Psych: Normal mood, full affect   Assessment & Plan:   Dyspnea  on exertion: Initially etiology unclear, felt to be due to uncontrolled asthma that improved with changing inhaler therapy from Breo/Spiriva to [place] HFA.  Chest imaging clear.  TTE reassuring, only grade 1 diastolic dysfunction.    Severe persistent asthma: Had worsening symptoms despite Xolair.  Repeat IgE within normal limits.  Symptoms improved with change to Breztri from high-dose Breo/Spiriva.  Exacerbation 12/2021 with viral illness.  With ongoing chest congestion.  Overall improved but worse than baseline.  To continue saline pressure, add azithromycin Monday Wednesday Friday for anti-inflammatory properties.  Rhinosinusitis: Related to allergic type symptoms.  She has good understanding of using antihistamines, etc. feels current congestion more in the chest as opposed to postnasal drip etc.  OSA: Good adherence to CPAP.  To continue.  Return in about 3 months (around 08/09/2022).   Lanier Clam, MD 05/09/2022

## 2022-05-09 NOTE — Patient Instructions (Signed)
Nice to see you again  Take azithromycin 1 tablet on Monday and Wednesday and Friday, do not take it the other days  No changes to medicines, inhalers refilled today  I sent a referral to ENT in [place] for the nasal congestion  Return to clinic in 3 months or sooner as needed with Dr. Silas Flood

## 2022-05-16 ENCOUNTER — Telehealth: Payer: Self-pay

## 2022-05-16 NOTE — Telephone Encounter (Signed)
PA request received via CMM for Breztri Aerosphere 160-9-4.8MCG/ACT aerosol  PA has been submitted to [facility] and is pending determination.  Key: [id number]

## 2022-05-22 NOTE — Telephone Encounter (Signed)
Patient Advocate Encounter  Received a fax from [employer] regarding Prior Authorization for [employer] 160-9-4.8MCG/ACT aerosol.   Authorization has been DENIED due to  This medication does not require a prior authorization as long as within the limits of 1 canister (10.7g) per 30 days.   Paid claim is on file on 05-07-2022

## 2022-05-24 IMAGING — CT CT ANGIO CHEST
2 of 8 series · 19 of 46 positions shown · IV contrast (omnipaque)
Comparison: 10/25/2009

CLINICAL DATA: Increasing asthma exacerbations, elevated D-dimer,
high prob PE suspicion

EXAM:
CT ANGIOGRAPHY CHEST WITH CONTRAST
TECHNIQUE: Multidetector CT imaging of the chest was performed using the
standard protocol during bolus administration of intravenous
contrast. Multiplanar CT image reconstructions and MIPs were
obtained to evaluate the vascular anatomy.
CONTRAST:  80mL OMNIPAQUE IOHEXOL 350 MG/ML SOLN

[Series 5: thins · axial · 0.67mm/px · z∈[-312,-46]mm · 16 of 300 slices shown]
[im 17/300  lung]
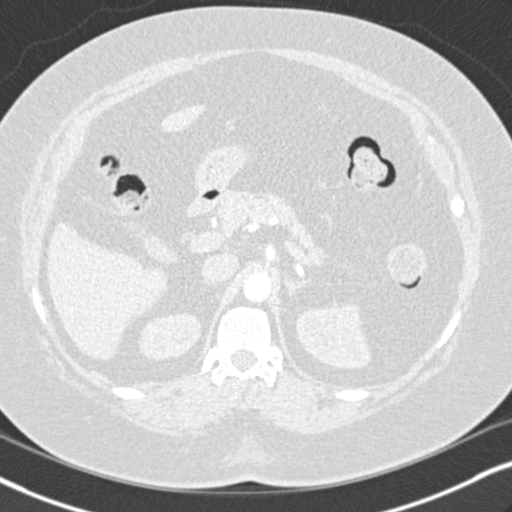
[im 34/300  soft-tissue]
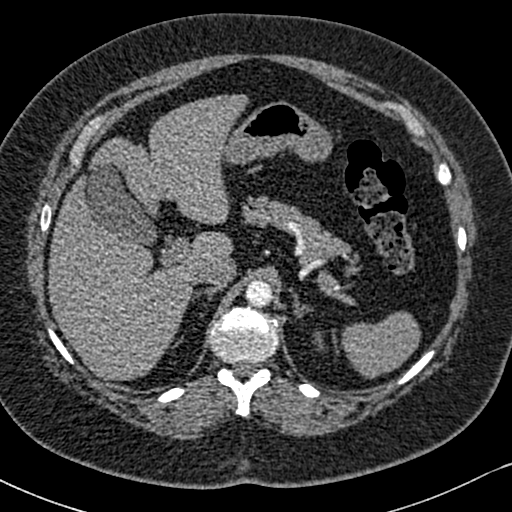
[im 50/300  lung]
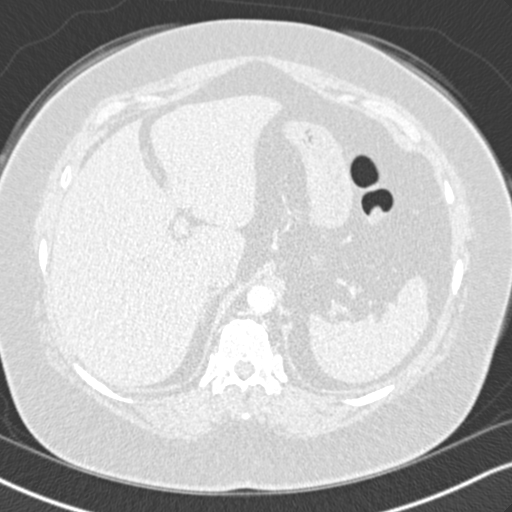
[im 67/300  soft-tissue]
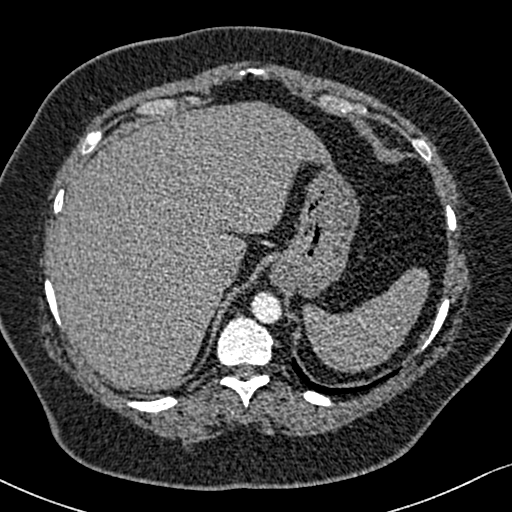
[im 84/300  lung]
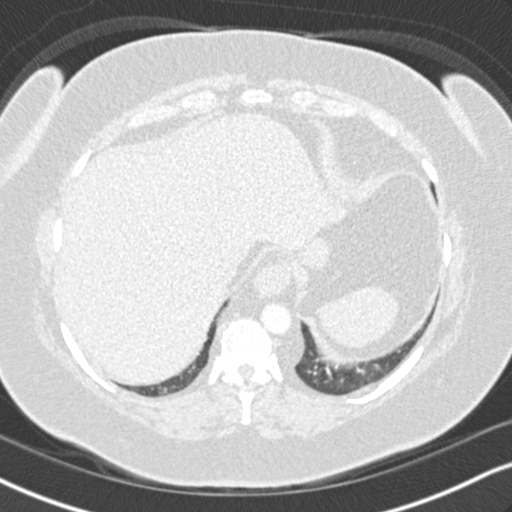
[im 100/300  soft-tissue]
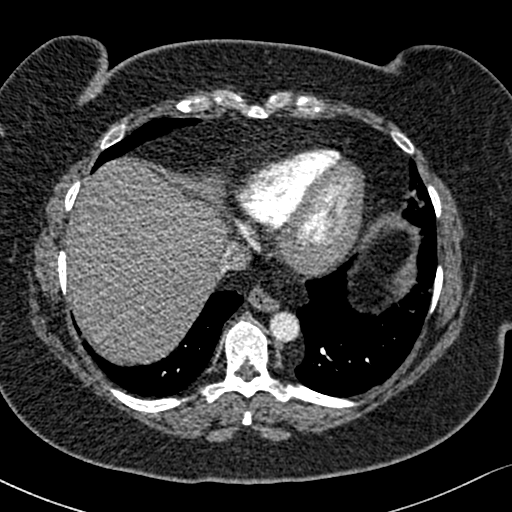
[im 117/300  lung]
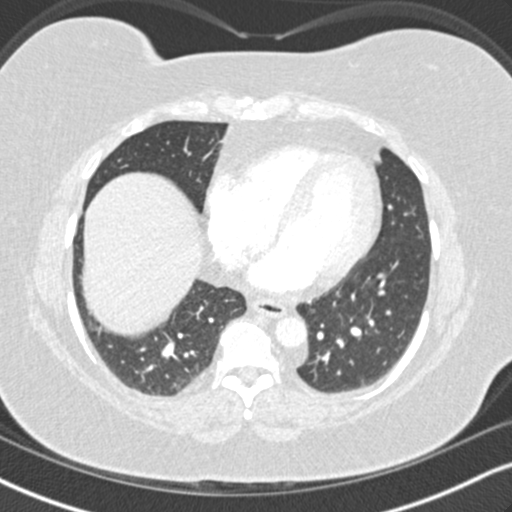
[im 133/300  soft-tissue]
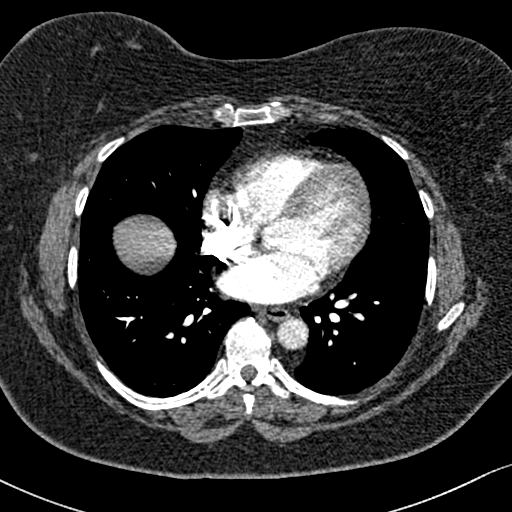
[im 167/300  lung]
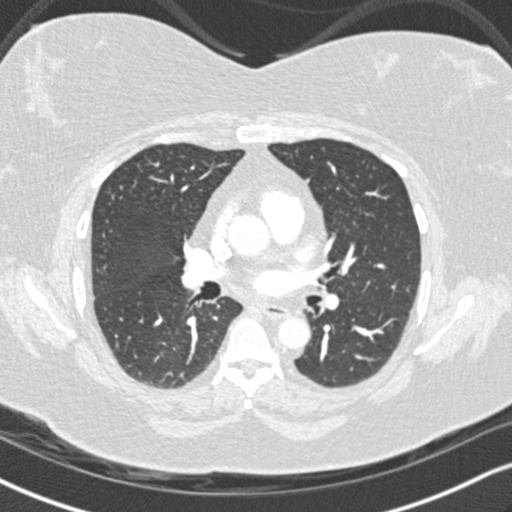
[im 183/300  soft-tissue]
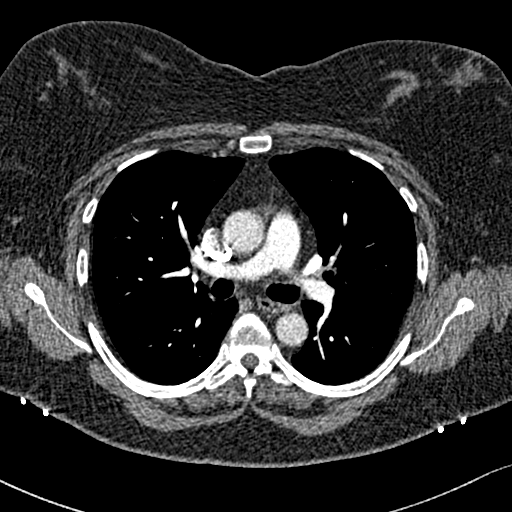
[im 200/300  lung]
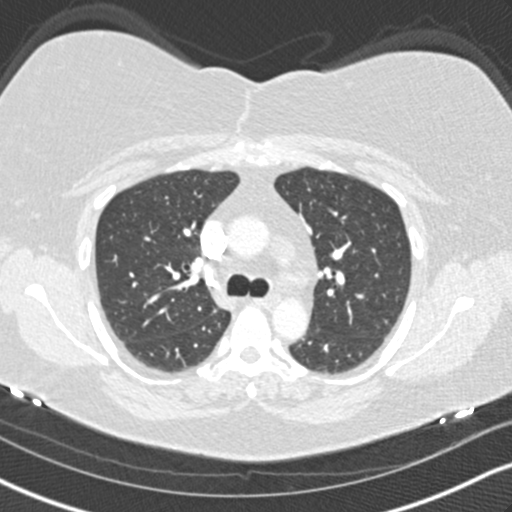
[im 216/300  soft-tissue]
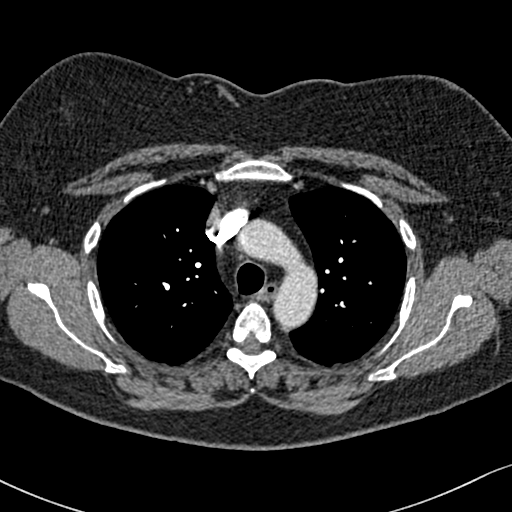
[im 233/300  lung]
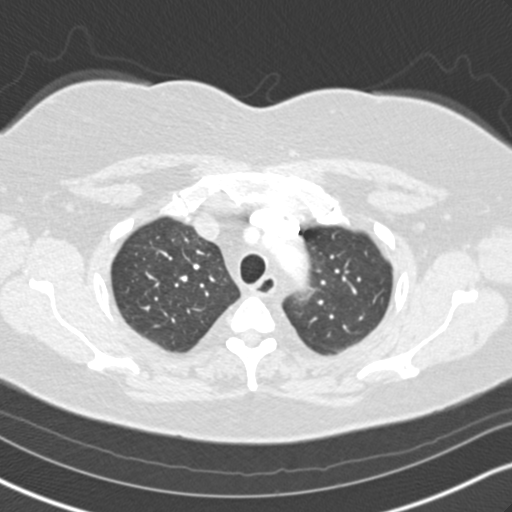
[im 250/300  soft-tissue]
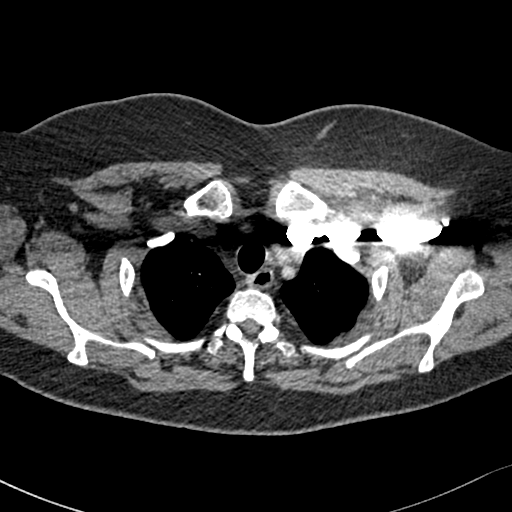
[im 266/300  lung]
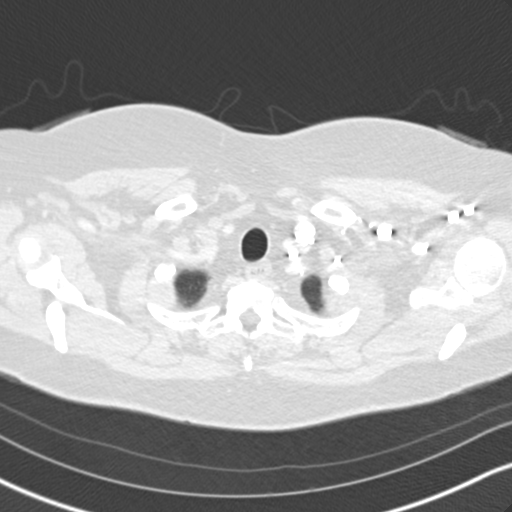
[im 283/300  soft-tissue]
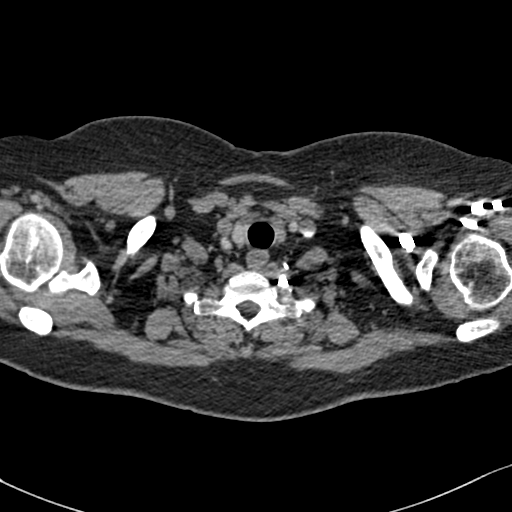

[Series 7: coronal mpr · coronal · 0.59mm/px · 3 of 122 slices shown]
[im 31/122  soft-tissue]
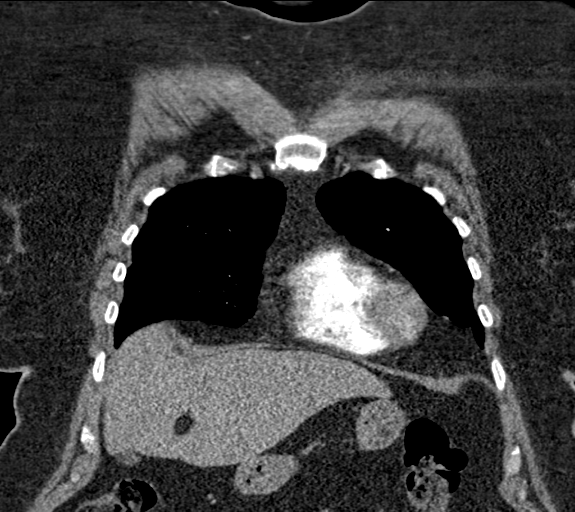
[im 61/122  soft-tissue]
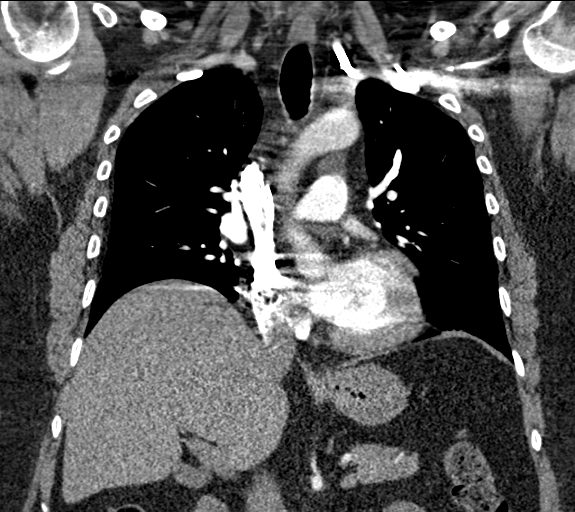
[im 91/122  soft-tissue]
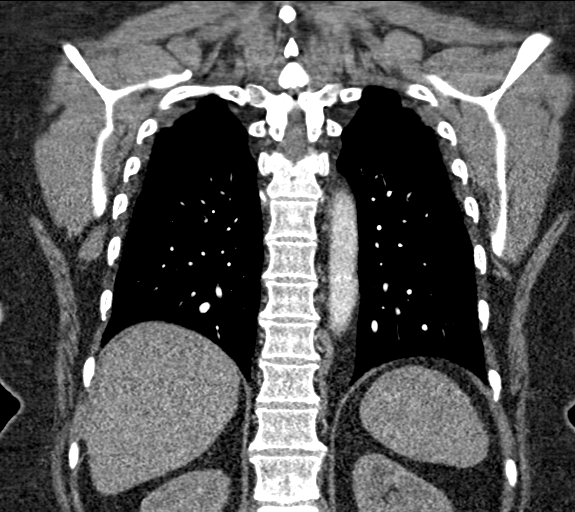

[19 of 46 positions shown; findings below may reference images not displayed]

FINDINGS: Cardiovascular: Heart size normal. No pericardial effusion. The RV
is nondilated. Satisfactory opacification of pulmonary arteries
noted, and there is no evidence of pulmonary emboli. Adequate
contrast opacification of the thoracic aorta with no evidence of
dissection, aneurysm, or stenosis. There is classic 3-vessel
brachiocephalic arch anatomy without proximal stenosis.

Mediastinum/Nodes: Small hiatal hernia.  No mass or adenopathy.

Lungs/Pleura: No pleural effusion. No pneumothorax. Lungs are clear.

Upper Abdomen: Small hiatal hernia.  No acute findings.

Musculoskeletal: No chest wall abnormality. No acute or significant
osseous findings.

Review of the MIP images confirms the above findings.
IMPRESSION: 1. Negative for acute PE.
2. Small hiatal hernia

## 2022-05-26 ENCOUNTER — Other Ambulatory Visit (HOSPITAL_COMMUNITY): Payer: Self-pay

## 2022-05-26 ENCOUNTER — Other Ambulatory Visit: Payer: Self-pay | Admitting: Allergy and Immunology

## 2022-05-26 ENCOUNTER — Other Ambulatory Visit: Payer: Self-pay

## 2022-05-26 MED ORDER — OMALIZUMAB 150 MG/ML ~~LOC~~ SOSY
300.0000 mg | PREFILLED_SYRINGE | SUBCUTANEOUS | 11 refills | Status: DC
  Filled 2022-05-26: qty 2, 28d supply, fill #0
  Filled 2022-06-24: qty 2, 28d supply, fill #1
  Filled 2022-07-18: qty 2, 28d supply, fill #2
  Filled 2022-08-18: qty 2, 28d supply, fill #3

## 2022-05-29 DIAGNOSIS — F332 Major depressive disorder, recurrent severe without psychotic features: Secondary | ICD-10-CM | POA: Diagnosis not present

## 2022-05-29 DIAGNOSIS — F411 Generalized anxiety disorder: Secondary | ICD-10-CM | POA: Diagnosis not present

## 2022-05-29 DIAGNOSIS — F5081 Binge eating disorder: Secondary | ICD-10-CM | POA: Diagnosis not present

## 2022-06-03 ENCOUNTER — Other Ambulatory Visit (HOSPITAL_COMMUNITY): Payer: Self-pay

## 2022-06-05 ENCOUNTER — Ambulatory Visit (INDEPENDENT_AMBULATORY_CARE_PROVIDER_SITE_OTHER): Payer: BC Managed Care – PPO | Admitting: *Deleted

## 2022-06-05 DIAGNOSIS — J4550 Severe persistent asthma, uncomplicated: Secondary | ICD-10-CM | POA: Diagnosis not present

## 2022-06-23 ENCOUNTER — Other Ambulatory Visit (HOSPITAL_COMMUNITY): Payer: Self-pay

## 2022-06-24 ENCOUNTER — Other Ambulatory Visit (HOSPITAL_COMMUNITY): Payer: Self-pay

## 2022-06-24 ENCOUNTER — Other Ambulatory Visit: Payer: Self-pay

## 2022-07-03 ENCOUNTER — Encounter: Payer: Self-pay | Admitting: *Deleted

## 2022-07-03 ENCOUNTER — Ambulatory Visit (INDEPENDENT_AMBULATORY_CARE_PROVIDER_SITE_OTHER): Payer: BC Managed Care – PPO | Admitting: *Deleted

## 2022-07-03 DIAGNOSIS — J4550 Severe persistent asthma, uncomplicated: Secondary | ICD-10-CM | POA: Diagnosis not present

## 2022-07-03 NOTE — Progress Notes (Signed)
This encounter was created in error - please disregard.

## 2022-07-18 ENCOUNTER — Other Ambulatory Visit (HOSPITAL_COMMUNITY): Payer: Self-pay

## 2022-07-18 DIAGNOSIS — R059 Cough, unspecified: Secondary | ICD-10-CM | POA: Diagnosis not present

## 2022-07-18 DIAGNOSIS — R490 Dysphonia: Secondary | ICD-10-CM | POA: Diagnosis not present

## 2022-07-18 DIAGNOSIS — J383 Other diseases of vocal cords: Secondary | ICD-10-CM | POA: Diagnosis not present

## 2022-07-18 DIAGNOSIS — J45909 Unspecified asthma, uncomplicated: Secondary | ICD-10-CM | POA: Diagnosis not present

## 2022-07-22 ENCOUNTER — Other Ambulatory Visit (HOSPITAL_COMMUNITY): Payer: Self-pay

## 2022-07-24 ENCOUNTER — Other Ambulatory Visit: Payer: Self-pay

## 2022-07-24 ENCOUNTER — Other Ambulatory Visit (HOSPITAL_COMMUNITY): Payer: Self-pay

## 2022-07-31 ENCOUNTER — Ambulatory Visit (INDEPENDENT_AMBULATORY_CARE_PROVIDER_SITE_OTHER): Payer: BC Managed Care – PPO | Admitting: *Deleted

## 2022-07-31 DIAGNOSIS — J4550 Severe persistent asthma, uncomplicated: Secondary | ICD-10-CM | POA: Diagnosis not present

## 2022-08-08 ENCOUNTER — Ambulatory Visit: Payer: BC Managed Care – PPO | Admitting: Pulmonary Disease

## 2022-08-08 ENCOUNTER — Encounter: Payer: Self-pay | Admitting: Pulmonary Disease

## 2022-08-08 VITALS — BP 112/82 | HR 98 | Temp 97.6°F | Ht 65.0 in | Wt 229.0 lb

## 2022-08-08 DIAGNOSIS — J4550 Severe persistent asthma, uncomplicated: Secondary | ICD-10-CM

## 2022-08-08 DIAGNOSIS — G4733 Obstructive sleep apnea (adult) (pediatric): Secondary | ICD-10-CM

## 2022-08-08 NOTE — Progress Notes (Signed)
@Patient  ID: Andrea Terrell, female    DOB: November 11, 1965, 57 y.o.   MRN: [id number]  Chief Complaint  Patient presents with   Follow-up    ACT:22 Breathing is much better     Referring provider: Paulina Fusi, MD  HPI:   57 y.o. woman whom we are seeing in follow-up for dyspnea on exertion and asthma.  Asthma previously well controlled on Xolair.  Worsened symptoms over the summer/fall 2022.  Fortunately great improvement switching Breo/Spiriva to [employer].  TTE obtained which is reassuring.  Return for routine follow up.  Symptoms a bit worse at last visit.  Prompting 34-month follow-up.  Started on azithromycin thrice weekly.  Took a few weeks but eventually symptoms improved and back to baseline.  Discussed may just improve with time versus effect of the azithromycin, anti-inflammatory effect.  Given things are better okay stopping and can always resume, she has a supply on hand at home, recently refilled.  Advised her to continue Breztri and Xolair injections.  HPI initial visit: Patient was in normal state of health about 3 months ago.  Relatively abrupt onset of significant dyspnea on exertion.  Very winded with steps, inclines.  Winded on flat surfaces as well.  No clear inciting event.  She denies viral infection or other infection, trauma etc.  She has been seen by PCP.  Also seen by allergy and immunology.  Received 2 courses of prednisone, course of doxycycline.  She thinks these provided mild intermittent relief but quickly returned to baseline worsening dyspnea shortly after completion.  She reports adherence to high-dose Breo, montelukast, Xolair injections every month.  She is on these injections for many years.  In some ways seems similar to prior asthma exacerbations but in other ways different.  She has CT abdomen pelvis 2019 with no comment on abnormality in the lower part of the lungs.  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 a theatre or a meeting) 0  As a passenger in a car for an hour without a break 2  Lying down to rest in the afternoon when circumstances permit 1  Sitting and talking to someone 0  Sitting quietly after a lunch without alcohol 0  In a car, while stopped for a few minutes in traffic 0  Total score 3    Tests:   FENO:  No results found for: "NITRICOXIDE"  PFT:     No data to display           WALK:      No data to display           Imaging: Personally reviewed as per EMR discussion this note   Lab Results: Personally reviewed and as per EMR CBC No results found for: "WBC", "RBC", "  HGB", "HCT", "PLT", "MCV", "MCH", "MCHC", "RDW", "LYMPHSABS", "MONOABS", "EOSABS", "BASOSABS"  BMET No results found for: "NA", "K", "CL", "CO2", "GLUCOSE", "BUN", "CREATININE", "CALCIUM", "GFRNONAA", "GFRAA"  BNP No results found for: "BNP"  ProBNP No results found for: "PROBNP"  Specialty Problems       Pulmonary Problems   Severe persistent asthma   OSA (obstructive sleep apnea)    Allergies  Allergen Reactions   Ciprofloxacin Other (See Comments)    Joint pain   Codeine Other (See Comments)    NERVOUSNESS   Keflex [Cephalexin] Hives   Penicillins Rash   Sulfa Antibiotics Rash    Immunization History  Administered Date(s) Administered   Influenza Inj Mdck Quad Pf  11/21/2016, 11/26/2018   Influenza Split 12/05/2009, 11/18/2011, 11/05/2012, 11/25/2013   Influenza,inj,Quad PF,6+ Mos 10/26/2015, 11/19/2017, 01/04/2021   Influenza-Unspecified 10/26/2015   Moderna Sars-Covid-2 Vaccination 04/07/2019, 05/02/2019   Pneumococcal Polysaccharide-23 07/16/2012    Past Medical History:  Diagnosis Date   Asthma    Hypothyroid    OSA (obstructive sleep apnea) 02/08/2016    Tobacco History: Social History   Tobacco Use  Smoking Status Former   Packs/day: 0.50   Years: 5.00   Additional pack years: 0.00   Total pack years: 2.50   Types: Cigarettes   Quit date: 1989   Years since quitting: 35.4  Smokeless Tobacco Never   Counseling given: Not Answered    Continue to not smoke  Outpatient Encounter Medications as of 08/08/2022  Medication Sig   albuterol (PROAIR HFA) 108 (90 Base) MCG/ACT inhaler Inhale 2 puffs every 4-6 hours if needed for cough or wheeze.   atorvastatin (LIPITOR) 20 MG tablet Take 20 mg by mouth daily.   AUVI-Q 0.3 MG/0.3ML SOAJ injection Use as directed for life-threatening allergic reaction   Budeson-Glycopyrrol-Formoterol (BREZTRI AEROSPHERE) 160-9-4.8 MCG/ACT AERO Inhale 2 puffs into the lungs in the morning and at bedtime.   fexofenadine (ALLEGRA) 180 MG tablet Take 180 mg by mouth daily as needed for allergies or rhinitis.   levothyroxine (SYNTHROID, LEVOTHROID) 75 MCG tablet    montelukast (SINGULAIR) 10 MG tablet Take 1 tablet (10 mg total) by mouth daily.   NON FORMULARY CPAP nightly   omalizumab (XOLAIR) 150 MG/ML prefilled syringe Inject 300 mg into the skin every 28 (twenty-eight) days.   venlafaxine XR (EFFEXOR-XR) 75 MG 24 hr capsule    Vitamin D, Ergocalciferol, (DRISDOL) 1.25 MG (50000 UT) CAPS capsule    VYVANSE 30 MG capsule Take 30 mg by mouth daily as needed.   XOLAIR 150 MG injection INJECT 300 MG SUBCUTANEOUSLY EVERY 4 WEEKS.   azithromycin (ZITHROMAX) 250 MG tablet Take 1 tablet (250 mg total) by mouth 3  (three) times a week. Monday, Wednesday, friday (Patient not taking: Reported on 08/08/2022)   Facility-Administered Encounter Medications as of 08/08/2022  Medication   omalizumab [place]ry [place]) injection 300 mg     Review of Systems  Review of Systems  N/a  Physical Exam  BP 112/82 (BP Location: Left Arm, Patient Position: Sitting, Cuff Size: Normal)   Pulse 98   Temp 97.6 F (36.4 C) (Temporal)   Ht 5\' 5"  (1.651 m)   Wt 229 lb (103.9 kg)   SpO2 98%   BMI 38.11 kg/m   Wt Readings from Last 5 Encounters:  08/08/22 229 lb (103.9 kg)  05/09/22 247 lb 3.2 oz (112.1 kg)  10/04/21 244 lb 12.8 oz (111 kg)  04/05/21 223 lb 6.4 oz (101.3 kg)  01/04/21 221 lb 6.4 oz (  100.4 kg)    BMI Readings from Last 5 Encounters:  08/08/22 38.11 kg/m  05/09/22 41.14 kg/m  10/04/21 40.74 kg/m  04/05/21 37.18 kg/m  01/04/21 36.84 kg/m     Physical Exam General: In no distress, sitting in chair Eyes: EOMI, no icterus Neck: Supple, no JVP Pulmonary: Clear, no wheeze, normal work of breathing Cardiovascular: Regular rate, regular rhythm, no murmur MSK: No synovitis, joint effusion Neuro: Normal gait, no weakness Psych: Normal mood, full affect   Assessment & Plan:   Dyspnea on exertion: Initially etiology unclear, felt to be due to uncontrolled asthma that improved with changing inhaler therapy from Breo/Spiriva to [place] HFA.  Chest imaging clear.  TTE reassuring, only grade 1 diastolic dysfunction.    Severe persistent asthma: Had worsening symptoms despite Xolair.  Repeat IgE within normal limits.  Symptoms improved with change to Breztri from high-dose Breo/Spiriva.  Exacerbation 12/2021 with viral illness.  Had ongoing congestion and worsening symptoms through the spring 2024 prompting addition of thrice weekly azithromycin.  Gradually symptoms have improved and back to baseline.  Okay to discontinue azithromycin, continue Breztri, Xolair, as needed albuterol.  Rhinosinusitis:  Related to allergic type symptoms.  She has good understanding of using antihistamines, etc. feels current congestion more in the chest as opposed to postnasal drip etc. recently met with ENT provider, no changes or recommendations made.  OSA: Good adherence to CPAP.  To continue.  Return in about 6 months (around 02/07/2023).   Karren Burly, MD 08/08/2022

## 2022-08-08 NOTE — Patient Instructions (Signed)
Nice to see you again  Stop the azithromycin - if symptoms get worse again just resume it Monday Wednesday Friday  No other changes to medications  Return to clinic in 6 months or sooner as needed

## 2022-08-18 ENCOUNTER — Other Ambulatory Visit (HOSPITAL_COMMUNITY): Payer: Self-pay

## 2022-08-19 ENCOUNTER — Other Ambulatory Visit: Payer: Self-pay

## 2022-08-22 DIAGNOSIS — F411 Generalized anxiety disorder: Secondary | ICD-10-CM | POA: Diagnosis not present

## 2022-08-22 DIAGNOSIS — F5081 Binge eating disorder: Secondary | ICD-10-CM | POA: Diagnosis not present

## 2022-08-22 DIAGNOSIS — F332 Major depressive disorder, recurrent severe without psychotic features: Secondary | ICD-10-CM | POA: Diagnosis not present

## 2022-08-25 ENCOUNTER — Ambulatory Visit: Payer: BC Managed Care – PPO

## 2022-08-25 ENCOUNTER — Ambulatory Visit: Payer: BC Managed Care – PPO | Admitting: Allergy and Immunology

## 2022-08-25 ENCOUNTER — Encounter: Payer: Self-pay | Admitting: Allergy and Immunology

## 2022-08-25 VITALS — BP 128/82 | HR 92 | Resp 16 | Wt 228.2 lb

## 2022-08-25 DIAGNOSIS — K219 Gastro-esophageal reflux disease without esophagitis: Secondary | ICD-10-CM | POA: Diagnosis not present

## 2022-08-25 DIAGNOSIS — J3089 Other allergic rhinitis: Secondary | ICD-10-CM

## 2022-08-25 DIAGNOSIS — J324 Chronic pansinusitis: Secondary | ICD-10-CM | POA: Diagnosis not present

## 2022-08-25 DIAGNOSIS — J9811 Atelectasis: Secondary | ICD-10-CM | POA: Diagnosis not present

## 2022-08-25 DIAGNOSIS — J4550 Severe persistent asthma, uncomplicated: Secondary | ICD-10-CM | POA: Diagnosis not present

## 2022-08-25 DIAGNOSIS — J45909 Unspecified asthma, uncomplicated: Secondary | ICD-10-CM | POA: Diagnosis not present

## 2022-08-25 DIAGNOSIS — R059 Cough, unspecified: Secondary | ICD-10-CM | POA: Diagnosis not present

## 2022-08-25 DIAGNOSIS — R0602 Shortness of breath: Secondary | ICD-10-CM | POA: Diagnosis not present

## 2022-08-25 MED ORDER — OMEPRAZOLE 40 MG PO CPDR: 40.0000 mg | DELAYED_RELEASE_CAPSULE | Freq: Every morning | ORAL | 5 refills | Status: DC

## 2022-08-25 MED ORDER — FAMOTIDINE 40 MG PO TABS: 40.0000 mg | ORAL_TABLET | Freq: Every evening | ORAL | 5 refills | Status: DC

## 2022-08-25 NOTE — Patient Instructions (Addendum)
  1. Continue Breztri - 2 inhalations 2 times per day  2. Continue Montelukast 10 mg tablet 1 time per day  3. Continue Xolair (and AUVI-Q 0.3)  4. If needed:   A. OTC Allegra 180 - 1 tablet 1-2 time per day  B. Proventil HFA or similar 2 inhalations every 4-6 hour  C. OTC Pataday - 1 drop each eye 1 time per day  5. Treat reflux induced respiratory inflammation:   A. Omeprazole 40 mg - 1 tablet in AM  B. Famotidine 40 mg - 1 tablet in PM  6. Obtain sinus CT scan for chronic sinusitis and chest X-ray for cough  7. Obtain blood - CBC w/d. Change Xolair???  8. Return to clinic in 4 weeks or earlier if problem

## 2022-08-25 NOTE — Progress Notes (Unsigned)
[name] - [place] - [place] - [place] - [place]   Follow-up Note  Referring Provider: Paulina Fusi, MD Primary Provider: Paulina Fusi, MD Date of Office Visit: 08/25/2022  Subjective:   Andrea Terrell (DOB: 1965-06-03) is a 57 y.o. female who [facility] in re-evaluation of the following:  HPI: Andrea Terrell returns to this clinic in evaluation of cough.  I have seen her in this clinic for severe persistent asthma treated with omalizumab and allergic rhinitis.  I last saw her in this clinic 25 July 2021 with a very severe case of poison ivy induced dermatitis which fortunately resolved.  Apparently in November 2023 she started to develop a cough and she went to the urgent care center and was treated with steroids and antibiotics yet still continues to have problems and then saw pulmonary and was started on azithromycin Monday Wednesday and Friday for 3 months and had evaluation with ENT with rhinoscopy which identified vocal cord atrophy.  Currently, she has cough and some of the material coming up with her cough is yellow and she has lots of throat clearing and this is disturbing both daytime and nighttime.  She has not really responded to albuterol.  She has not responded any previous therapy administered in the past.  She does not have any anosmia although sometimes when she blows her nose it is yellow.  She does not have any classic reflux symptoms.  Allergies as of 08/25/2022       Reactions   Ciprofloxacin Other (See Comments)   Joint pain   Codeine Other (See Comments)   NERVOUSNESS   Keflex [cephalexin] Hives   Penicillins Rash   Sulfa Antibiotics Rash        Medication List    albuterol 108 (90 Base) MCG/ACT inhaler Commonly known as: ProAir HFA Inhale 2 puffs every 4-6 hours if needed for cough or wheeze.   atorvastatin 20 MG tablet Commonly known as: LIPITOR Take 20 mg by mouth daily.   Auvi-Q 0.3  mg/0.3 mL Soaj injection Generic drug: EPINEPHrine Use as directed for life-threatening allergic reaction   azithromycin 250 MG tablet Commonly known as: ZITHROMAX Take 1 tablet (250 mg total) by mouth 3 (three) times a week. Monday, Wednesday, friday   Breztri Aerosphere 160-9-4.8 MCG/ACT Aero Generic drug: Budeson-Glycopyrrol-Formoterol Inhale 2 puffs into the lungs in the morning and at bedtime.   fexofenadine 180 MG tablet Commonly known as: ALLEGRA Take 180 mg by mouth daily as needed for allergies or rhinitis.   levothyroxine 75 MCG tablet Commonly known as: SYNTHROID   montelukast 10 MG tablet Commonly known as: SINGULAIR Take 1 tablet (10 mg total) by mouth daily.   NON FORMULARY CPAP nightly   venlafaxine XR 75 MG 24 hr capsule Commonly known as: EFFEXOR-XR   Vitamin D (Ergocalciferol) 1.25 MG (50000 UNIT) Caps capsule Commonly known as: DRISDOL   Vyvanse 30 MG capsule Generic drug: lisdexamfetamine Take 30 mg by mouth daily as needed.   Xolair 150 MG injection Generic drug: omalizumab INJECT 300 MG SUBCUTANEOUSLY EVERY 4 WEEKS.   Xolair 150 MG/ML prefilled syringe Generic drug: omalizumab Inject 300 mg into the skin every 28 (twenty-eight) days.    Past Medical History:  Diagnosis Date   Asthma    Hypothyroid    OSA (obstructive sleep apnea) 02/08/2016    Past Surgical History:  Procedure Laterality Date   ABDOMINAL HERNIA REPAIR     age 33  ADENOIDECTOMY     CERVICAL ABLATION  2006   FINGER SURGERY     age 56   TONSILLECTOMY     TUBAL LIGATION  1996    Review of systems negative except as noted in HPI / PMHx or noted below:  Review of Systems  Constitutional: Negative.   HENT: Negative.    Eyes: Negative.   Respiratory: Negative.    Cardiovascular: Negative.   Gastrointestinal: Negative.   Genitourinary: Negative.   Musculoskeletal: Negative.   Skin: Negative.   Neurological: Negative.   Endo/Heme/Allergies: Negative.    Psychiatric/Behavioral: Negative.       Objective:   Vitals:   08/25/22 1103  BP: 128/82  Pulse: 92  Resp: 16  SpO2: 97%      Weight: 228 lb 3.2 oz (103.5 kg)   Physical Exam Constitutional:      Appearance: She is not diaphoretic.     Comments: Raspy voice, throat clearing  HENT:     Head: Normocephalic.     Right Ear: Tympanic membrane, ear canal and external ear normal.     Left Ear: Tympanic membrane, ear canal and external ear normal.     Nose: Nose normal. No mucosal edema or rhinorrhea.     Mouth/Throat:     Pharynx: Uvula midline. No oropharyngeal exudate.  Eyes:     Conjunctiva/sclera: Conjunctivae normal.  Neck:     Thyroid: No thyromegaly.     Trachea: Trachea normal. No tracheal tenderness or tracheal deviation.  Cardiovascular:     Rate and Rhythm: Normal rate and regular rhythm.     Heart sounds: Normal heart sounds, S1 normal and S2 normal. No murmur heard. Pulmonary:     Effort: No respiratory distress.     Breath sounds: Normal breath sounds. No stridor. No wheezing or rales.  Lymphadenopathy:     Head:     Right side of head: No tonsillar adenopathy.     Left side of head: No tonsillar adenopathy.     Cervical: No cervical adenopathy.  Skin:    Findings: No erythema or rash.     Nails: There is no clubbing.  Neurological:     Mental Status: She is alert.     Diagnostics: Spirometry was performed and demonstrated an FEV1 of 2.39 at 90 % of predicted.  Assessment and Plan:   1. Not well controlled severe persistent asthma   2. Other allergic rhinitis   3. LPRD (laryngopharyngeal reflux disease)   4. Chronic pansinusitis   5. Severe persistent asthma, unspecified whether complicated    1. Continue Breztri - 2 inhalations 2 times per day  2. Continue Montelukast 10 mg tablet 1 time per day  3. Continue Xolair (and AUVI-Q 0.3)  4. If needed:   A. OTC Allegra 180 - 1 tablet 1-2 time per day  B. Proventil HFA or similar 2 inhalations  every 4-6 hour  C. OTC Pataday - 1 drop each eye 1 time per day  5. Treat reflux induced respiratory inflammation:   A. Omeprazole 40 mg - 1 tablet in AM  B. Famotidine 40 mg - 1 tablet in PM  6. Obtain sinus CT scan for chronic sinusitis and chest X-ray for cough  7. Obtain blood - CBC w/d. Change Xolair???  8. Return to clinic in 4 weeks or earlier if problem  Kambre has a inflamed and irritated respiratory track and the etiologic factor responsible for this issue is not entirely clear.  Her presentation does suggest  some degree of reflux induced respiratory disease and she may also have rolled over into a component of chronic sinusitis which we will treat with the therapy noted above and also evaluate her for a chronic respiratory infection with the imaging studies noted above.  [name], MD Allergy / Immunology [facility]

## 2022-08-26 ENCOUNTER — Encounter: Payer: Self-pay | Admitting: Allergy and Immunology

## 2022-08-26 LAB — CBC WITH DIFFERENTIAL
Basophils Absolute: 0.1 10*3/uL (ref 0.0–0.2)
Basos: 1 %
EOS (ABSOLUTE): 0.2 10*3/uL (ref 0.0–0.4)
Eos: 2 %
Hematocrit: 40.8 % (ref 34.0–46.6)
Hemoglobin: 13.3 g/dL (ref 11.1–15.9)
Immature Grans (Abs): 0 10*3/uL (ref 0.0–0.1)
Immature Granulocytes: 0 %
Lymphocytes Absolute: 1.8 10*3/uL (ref 0.7–3.1)
Lymphs: 20 %
MCH: 29.6 pg (ref 26.6–33.0)
MCHC: 32.6 g/dL (ref 31.5–35.7)
MCV: 91 fL (ref 79–97)
Monocytes Absolute: 0.8 10*3/uL (ref 0.1–0.9)
Monocytes: 8 %
Neutrophils Absolute: 6.2 10*3/uL (ref 1.4–7.0)
Neutrophils: 69 %
RBC: 4.49 x10E6/uL (ref 3.77–5.28)
RDW: 13.1 % (ref 11.7–15.4)
WBC: 8.9 10*3/uL (ref 3.4–10.8)

## 2022-08-27 ENCOUNTER — Ambulatory Visit: Payer: BC Managed Care – PPO

## 2022-09-02 ENCOUNTER — Telehealth: Payer: Self-pay

## 2022-09-02 NOTE — Telephone Encounter (Signed)
Sinus CT has been scheduled at the outpatient center.  Appointment Date: 09/05/2022  Time: 10:45   Prior Auth # [id number]  Exp date: 09/30/2022   Left voicemail for patient to call back

## 2022-09-05 DIAGNOSIS — J324 Chronic pansinusitis: Secondary | ICD-10-CM | POA: Diagnosis not present

## 2022-09-05 DIAGNOSIS — J329 Chronic sinusitis, unspecified: Secondary | ICD-10-CM | POA: Diagnosis not present

## 2022-09-11 ENCOUNTER — Other Ambulatory Visit (HOSPITAL_COMMUNITY): Payer: Self-pay

## 2022-09-12 DIAGNOSIS — L719 Rosacea, unspecified: Secondary | ICD-10-CM | POA: Diagnosis not present

## 2022-09-17 ENCOUNTER — Telehealth: Payer: Self-pay

## 2022-09-17 NOTE — Telephone Encounter (Signed)
Called and informed patient that Dr. Lucie Leather had received her Sinus CT Scan and per Dr.Kozlow, no sinusitis.

## 2022-09-18 ENCOUNTER — Other Ambulatory Visit: Payer: Self-pay

## 2022-09-18 ENCOUNTER — Telehealth: Payer: Self-pay | Admitting: *Deleted

## 2022-09-18 ENCOUNTER — Other Ambulatory Visit (HOSPITAL_COMMUNITY): Payer: Self-pay

## 2022-09-18 MED ORDER — TEZSPIRE 210 MG/1.91ML ~~LOC~~ SOSY
210.0000 mg | PREFILLED_SYRINGE | SUBCUTANEOUS | 11 refills | Status: DC
  Filled 2022-09-18: qty 1.91, 28d supply, fill #0

## 2022-09-18 NOTE — Telephone Encounter (Signed)
-----   Message from [facility] Shanda Bumps G sent at 09/09/2022 11:03 AM EDT ----- Patient informed and verbalized understanding.

## 2022-09-18 NOTE — Telephone Encounter (Signed)
Spoke to patient and advised change Tezspire and Rx to [facility] with copay card. Will reach out once delivery set to make appt to start therapy

## 2022-09-19 ENCOUNTER — Encounter: Payer: Self-pay | Admitting: *Deleted

## 2022-09-22 ENCOUNTER — Ambulatory Visit: Payer: BC Managed Care – PPO

## 2022-09-24 ENCOUNTER — Other Ambulatory Visit (HOSPITAL_COMMUNITY): Payer: Self-pay

## 2022-09-24 ENCOUNTER — Other Ambulatory Visit: Payer: Self-pay

## 2022-09-24 ENCOUNTER — Encounter: Payer: Self-pay | Admitting: *Deleted

## 2022-09-24 ENCOUNTER — Other Ambulatory Visit: Payer: Self-pay | Admitting: *Deleted

## 2022-09-24 MED ORDER — TEZSPIRE 210 MG/1.91ML ~~LOC~~ SOAJ
210.0000 mg | SUBCUTANEOUS | 11 refills | Status: DC
  Filled 2022-09-24: qty 1.91, fill #0
  Filled 2022-10-16 (×2): qty 1.91, 28d supply, fill #0
  Filled 2022-11-07: qty 1.91, 28d supply, fill #1
  Filled 2022-12-10: qty 1.91, 28d supply, fill #2
  Filled 2022-12-29: qty 1.91, 28d supply, fill #3
  Filled 2023-02-03: qty 1.91, 28d supply, fill #4
  Filled 2023-03-04: qty 1.91, 28d supply, fill #5
  Filled 2023-03-25: qty 1.91, 28d supply, fill #6
  Filled 2023-05-01 (×2): qty 1.91, 28d supply, fill #7
  Filled 2023-05-26 (×2): qty 1.91, 28d supply, fill #8
  Filled 2023-06-23: qty 1.91, 28d supply, fill #9
  Filled 2023-07-22: qty 1.91, 28d supply, fill #10
  Filled 2023-08-24: qty 1.91, 28d supply, fill #11

## 2022-09-25 ENCOUNTER — Ambulatory Visit: Payer: BC Managed Care – PPO | Admitting: Allergy and Immunology

## 2022-09-25 ENCOUNTER — Ambulatory Visit: Payer: BC Managed Care – PPO

## 2022-09-25 ENCOUNTER — Encounter: Payer: Self-pay | Admitting: Allergy and Immunology

## 2022-09-25 VITALS — BP 142/92 | HR 84 | Resp 18

## 2022-09-25 DIAGNOSIS — K219 Gastro-esophageal reflux disease without esophagitis: Secondary | ICD-10-CM | POA: Diagnosis not present

## 2022-09-25 DIAGNOSIS — J3089 Other allergic rhinitis: Secondary | ICD-10-CM

## 2022-09-25 DIAGNOSIS — J4550 Severe persistent asthma, uncomplicated: Secondary | ICD-10-CM

## 2022-09-25 MED ORDER — TEZEPELUMAB-EKKO 210 MG/1.91ML ~~LOC~~ SOSY
210.0000 mg | PREFILLED_SYRINGE | Freq: Once | SUBCUTANEOUS | Status: AC
Start: 2022-09-25 — End: 2022-09-25
  Administered 2022-09-25: 210 mg via SUBCUTANEOUS

## 2022-09-25 MED ORDER — MONTELUKAST SODIUM 10 MG PO TABS: 10.0000 mg | ORAL_TABLET | Freq: Every day | ORAL | 5 refills | Status: DC

## 2022-09-25 NOTE — Progress Notes (Signed)
Started Tezspire today. I demonstrated how to self administer with our demo device. I went ahead and gave her injection today but moving forward, she will continue her injections at home. She verbalized understanding and had no questions.

## 2022-09-25 NOTE — Progress Notes (Signed)
[place] - [place] - [place] - [place] - [place]   Follow-up Note  Referring Provider: Paulina Fusi, MD Primary Provider: Paulina Fusi, MD Date of Office Visit: 09/25/2022  Subjective:   Andrea Terrell (DOB: Jun 30, 1965) is a 57 y.o. female who [facility]22 in re-evaluation of the following:  HPI: Editha returns to this clinic in evaluation of asthma and allergic rhinitis and LPR.  I last saw her in this clinic 25 August 2022.  She thinks that she is better.  She is definitely better regarding her cough and the mucus in her throat and throat clearing.  She has not had much shortness of breath and she has not had much chest tightness and she is not using any short acting bronchodilator.  She has not had any classic reflux symptoms.  She has been having some nasal congestion without any anosmia or ugly nasal discharge.  Allergies as of 09/25/2022       Reactions   Ciprofloxacin Other (See Comments)   Joint pain   Codeine Other (See Comments)   NERVOUSNESS   Keflex [cephalexin] Hives   Penicillins Rash   Sulfa Antibiotics Rash        Medication List    albuterol 108 (90 Base) MCG/ACT inhaler Commonly known as: ProAir HFA Inhale 2 puffs every 4-6 hours if needed for cough or wheeze.   Auvi-Q 0.3 MG/0.3ML Soaj injection Generic drug: EPINEPHrine Use as directed for life-threatening allergic reaction   Breztri Aerosphere 160-9-4.8 MCG/ACT Aero Generic drug: Budeson-Glycopyrrol-Formoterol Inhale 2 puffs into the lungs in the morning and at bedtime.   doxycycline 100 MG capsule Commonly known as: VIBRAMYCIN Take 100 mg by mouth daily.   famotidine 40 MG tablet Commonly known as: PEPCID Take 1 tablet (40 mg total) by mouth every evening.   fexofenadine 180 MG tablet Commonly known as: ALLEGRA Take 180 mg by mouth daily as needed for allergies or rhinitis.   KRILL OIL PO Take by mouth daily.    levothyroxine 75 MCG tablet Commonly known as: SYNTHROID   montelukast 10 MG tablet Commonly known as: SINGULAIR Take 1 tablet (10 mg total) by mouth daily.   NON FORMULARY CPAP nightly   omeprazole 40 MG capsule Commonly known as: PRILOSEC Take 1 capsule (40 mg total) by mouth in the morning.   Tezspire 210 MG/1. Soaj Generic drug: Tezepelumab-ekko Inject 210 mg into the skin every 28 (twenty-eight) days.   venlafaxine XR 150 MG 24 hr capsule Commonly known as: EFFEXOR-XR Take 150 mg by mouth daily.   VITAMIN C PO Take by mouth.   Vitamin D (Ergocalciferol) 1.25 MG (50000 UNIT) Caps capsule Commonly known as: DRISDOL   Vyvanse 30 MG capsule Generic drug: lisdexamfetamine Take 30 mg by mouth daily as needed.    Past Medical History:  Diagnosis Date   Asthma    Hypothyroid    OSA (obstructive sleep apnea) 02/08/2016    Past Surgical History:  Procedure Laterality Date   ABDOMINAL HERNIA REPAIR     age 1   ADENOIDECTOMY     CERVICAL ABLATION  2006   FINGER SURGERY     age 31   TONSILLECTOMY     TUBAL LIGATION  1996    Review of systems negative except as noted in HPI / PMHx or noted below:  Review of Systems  Constitutional: Negative.   HENT: Negative.    Eyes: Negative.   Respiratory: Negative.  Cardiovascular: Negative.   Gastrointestinal: Negative.   Genitourinary: Negative.   Musculoskeletal: Negative.   Skin: Negative.   Neurological: Negative.   Endo/Heme/Allergies: Negative.   Psychiatric/Behavioral: Negative.       Objective:   Vitals:   09/25/22 1704  BP: (!) 142/92  Pulse: 84  Resp: 18          Physical Exam Constitutional:      Appearance: She is not diaphoretic.     Comments: Throat clearing, raspy voice  HENT:     Head: Normocephalic.     Right Ear: Tympanic membrane, ear canal and external ear normal.     Left Ear: Tympanic membrane, ear canal and external ear normal.     Nose: Nose normal. No mucosal edema or  rhinorrhea.     Mouth/Throat:     Pharynx: Uvula midline. No oropharyngeal exudate.  Eyes:     Conjunctiva/sclera: Conjunctivae normal.  Neck:     Thyroid: No thyromegaly.     Trachea: Trachea normal. No tracheal tenderness or tracheal deviation.  Cardiovascular:     Rate and Rhythm: Normal rate and regular rhythm.     Heart sounds: Normal heart sounds, S1 normal and S2 normal. No murmur heard. Pulmonary:     Effort: No respiratory distress.     Breath sounds: Normal breath sounds. No stridor. No wheezing or rales.  Lymphadenopathy:     Head:     Right side of head: No tonsillar adenopathy.     Left side of head: No tonsillar adenopathy.     Cervical: No cervical adenopathy.  Skin:    Findings: No erythema or rash.     Nails: There is no clubbing.  Neurological:     Mental Status: She is alert.     Diagnostics: Spirometry was performed and demonstrated an FEV1 of 2.40 at 90.23 % of predicted.  Assessment and Plan:   1. Not well controlled severe persistent asthma   2. Other allergic rhinitis   3. LPRD (laryngopharyngeal reflux disease)    1. Continue to treat and prevent inflammation:  A. Breztri - 2 inhalations 2 times per day B. Montelukast 10 mg tablet 1 time per day C. OTC Rhinocort / budesonide - 1-2 sprays each nostril 1 time per day D. Tezepelumab injections  2. Treat reflux induced respiratory inflammation:   A. Omeprazole 40 mg - 1 tablet in AM  B. Famotidine 40 mg - 1 tablet in PM  C. Minimize caffeine consumption  D. Replace throat clearing with swallowing / drinking maneuver  3. If needed:   A. OTC Allegra 180 - 1 tablet 1-2 time per day  B. Proventil HFA or similar 2 inhalations every 4-6 hour  C. OTC Pataday - 1 drop each eye 1 time per day  4. Plan for fall flu vaccine  5. Return to clinic in 6 months or earlier if problem  Myrl is better at this point in time and I have asked her to continue to utilize anti-inflammatory agents for her airway  including the use of an anti-TSLP antibody and I have again asked her to continue to aggressively treat her reflux.  She will need to replace her throat clearing with a swallowing or drinking maneuver as she does perform throat clearing a significant amount and this is giving rise to some inflammation of her mid airway.  Will see what happens over the course the next 6 months with this plan.  [name], MD Allergy / Immunology [place]

## 2022-09-25 NOTE — Patient Instructions (Signed)
  1. Continue to treat and prevent inflammation:  A. Breztri - 2 inhalations 2 times per day B. Montelukast 10 mg tablet 1 time per day C. OTC Rhinocort / budesonide - 1-2 sprays each nostril 1 time per day D. Tezepelumab injections  2. Treat reflux induced respiratory inflammation:   A. Omeprazole 40 mg - 1 tablet in AM  B. Famotidine 40 mg - 1 tablet in PM  C. Minimize caffeine consumption  D. Replace throat clearing with swallowing / drinking maneuver  3. If needed:   A. OTC Allegra 180 - 1 tablet 1-2 time per day  B. Proventil HFA or similar 2 inhalations every 4-6 hour  C. OTC Pataday - 1 drop each eye 1 time per day  4. Plan for fall flu vaccine  5. Return to clinic in 6 months or earlier if problem

## 2022-09-26 DIAGNOSIS — Z6837 Body mass index (BMI) 37.0-37.9, adult: Secondary | ICD-10-CM | POA: Diagnosis not present

## 2022-09-26 DIAGNOSIS — Z0000 Encounter for general adult medical examination without abnormal findings: Secondary | ICD-10-CM | POA: Diagnosis not present

## 2022-09-26 DIAGNOSIS — Z131 Encounter for screening for diabetes mellitus: Secondary | ICD-10-CM | POA: Diagnosis not present

## 2022-09-26 DIAGNOSIS — F331 Major depressive disorder, recurrent, moderate: Secondary | ICD-10-CM | POA: Diagnosis not present

## 2022-09-26 DIAGNOSIS — E559 Vitamin D deficiency, unspecified: Secondary | ICD-10-CM | POA: Diagnosis not present

## 2022-09-26 DIAGNOSIS — E785 Hyperlipidemia, unspecified: Secondary | ICD-10-CM | POA: Diagnosis not present

## 2022-09-26 DIAGNOSIS — E039 Hypothyroidism, unspecified: Secondary | ICD-10-CM | POA: Diagnosis not present

## 2022-09-29 ENCOUNTER — Encounter: Payer: Self-pay | Admitting: Allergy and Immunology

## 2022-10-02 ENCOUNTER — Other Ambulatory Visit: Payer: Self-pay

## 2022-10-03 ENCOUNTER — Other Ambulatory Visit (HOSPITAL_COMMUNITY): Payer: Self-pay

## 2022-10-16 ENCOUNTER — Other Ambulatory Visit (HOSPITAL_COMMUNITY): Payer: Self-pay

## 2022-10-16 ENCOUNTER — Other Ambulatory Visit: Payer: Self-pay

## 2022-10-17 ENCOUNTER — Other Ambulatory Visit (HOSPITAL_COMMUNITY): Payer: Self-pay

## 2022-11-06 DIAGNOSIS — M7041 Prepatellar bursitis, right knee: Secondary | ICD-10-CM | POA: Diagnosis not present

## 2022-11-07 ENCOUNTER — Other Ambulatory Visit (HOSPITAL_COMMUNITY): Payer: Self-pay

## 2022-11-12 ENCOUNTER — Other Ambulatory Visit (HOSPITAL_COMMUNITY): Payer: Self-pay

## 2022-11-20 DIAGNOSIS — M722 Plantar fascial fibromatosis: Secondary | ICD-10-CM | POA: Diagnosis not present

## 2022-11-21 ENCOUNTER — Other Ambulatory Visit (HOSPITAL_COMMUNITY): Payer: Self-pay

## 2022-11-21 DIAGNOSIS — M25561 Pain in right knee: Secondary | ICD-10-CM | POA: Diagnosis not present

## 2022-11-21 DIAGNOSIS — F5081 Binge eating disorder: Secondary | ICD-10-CM | POA: Diagnosis not present

## 2022-11-21 DIAGNOSIS — F332 Major depressive disorder, recurrent severe without psychotic features: Secondary | ICD-10-CM | POA: Diagnosis not present

## 2022-11-21 DIAGNOSIS — M7041 Prepatellar bursitis, right knee: Secondary | ICD-10-CM | POA: Diagnosis not present

## 2022-11-21 DIAGNOSIS — F411 Generalized anxiety disorder: Secondary | ICD-10-CM | POA: Diagnosis not present

## 2022-12-10 ENCOUNTER — Other Ambulatory Visit: Payer: Self-pay

## 2022-12-10 NOTE — Progress Notes (Signed)
Specialty Pharmacy Refill Coordination Note  Andrea Terrell is a 57 y.o. female contacted today regarding refills of specialty medication(s) Tezepelumab-Ekko   Patient requested Delivery   Delivery date: 12/12/22   Verified address: [address]   [place] [address]   Medication will be filled on 12/11/22.

## 2022-12-17 DIAGNOSIS — H35411 Lattice degeneration of retina, right eye: Secondary | ICD-10-CM | POA: Diagnosis not present

## 2022-12-17 DIAGNOSIS — H25813 Combined forms of age-related cataract, bilateral: Secondary | ICD-10-CM | POA: Diagnosis not present

## 2022-12-17 DIAGNOSIS — H04123 Dry eye syndrome of bilateral lacrimal glands: Secondary | ICD-10-CM | POA: Diagnosis not present

## 2022-12-17 DIAGNOSIS — H18832 Recurrent erosion of cornea, left eye: Secondary | ICD-10-CM | POA: Diagnosis not present

## 2022-12-18 DIAGNOSIS — M7041 Prepatellar bursitis, right knee: Secondary | ICD-10-CM | POA: Diagnosis not present

## 2022-12-23 ENCOUNTER — Telehealth: Payer: Self-pay | Admitting: Pulmonary Disease

## 2022-12-23 NOTE — Telephone Encounter (Signed)
Patient states needs pressure settings for CPAP machine. Patient phone number is [phone].

## 2022-12-29 ENCOUNTER — Other Ambulatory Visit (HOSPITAL_COMMUNITY): Payer: Self-pay | Admitting: Pharmacy Technician

## 2022-12-29 ENCOUNTER — Other Ambulatory Visit (HOSPITAL_COMMUNITY): Payer: Self-pay

## 2022-12-29 NOTE — Telephone Encounter (Signed)
Called pt to get clarification on message. No answer, lvm for pt to give the office a call back.

## 2022-12-29 NOTE — Progress Notes (Signed)
Specialty Pharmacy Refill Coordination Note  Andrea Terrell is a 57 y.o. female contacted today regarding refills of specialty medication(s) Tezepelumab-Ekko   Patient requested Delivery   Delivery date: 01/13/23   Verified address: [address] [place]   Medication will be filled on 01/12/23.

## 2023-01-02 NOTE — Telephone Encounter (Signed)
I called and spoke with the pt  She states that she has figured out how to set her pressure on her old CPAP machine  Nothing needed at this time  Will close encounter

## 2023-01-05 DIAGNOSIS — M7041 Prepatellar bursitis, right knee: Secondary | ICD-10-CM | POA: Diagnosis not present

## 2023-01-09 ENCOUNTER — Encounter: Payer: Self-pay | Admitting: Pulmonary Disease

## 2023-01-09 ENCOUNTER — Telehealth: Payer: BC Managed Care – PPO | Admitting: Pulmonary Disease

## 2023-01-09 DIAGNOSIS — R0609 Other forms of dyspnea: Secondary | ICD-10-CM

## 2023-01-09 DIAGNOSIS — J4550 Severe persistent asthma, uncomplicated: Secondary | ICD-10-CM

## 2023-01-09 MED ORDER — ALBUTEROL SULFATE HFA 108 (90 BASE) MCG/ACT IN AERS: INHALATION_SPRAY | RESPIRATORY_TRACT | 6 refills | Status: DC

## 2023-01-09 MED ORDER — BREZTRI AEROSPHERE 160-9-4.8 MCG/ACT IN AERO
2.0000 | INHALATION_SPRAY | Freq: Two times a day (BID) | RESPIRATORY_TRACT | 12 refills | Status: DC
Start: 2023-01-09 — End: 2023-06-04

## 2023-01-09 NOTE — Progress Notes (Signed)
@Patient  ID: Andrea Terrell, female    DOB: 1965/10/05, 57 y.o.   MRN: [id number]  No chief complaint on file. CC: cough, asthma  Referring provider: Paulina Fusi, MD  HPI:   57 y.o. woman whom we are seeing in follow-up for dyspnea on exertion and asthma.  Asthma previously well controlled on Xolair.  Worsened symptoms over the summer/fall 2022.  Fortunately great improvement switching Breo/Spiriva to [employer].  TTE obtained which is reassuring. Transitioned from [place] to [place] fall 2024.  Return for routine follow up.  Overall doing quite well.  Was de-escalated off of Monday Wednesday Friday azithromycin at last visit several months ago.  Symptoms stable and not worsened.  Her Xolair was switched to Tezspire by her allergist.  In general, rare albuterol use 1-2 times monthly.  Over the last week she is using it about every other day or more.  She thinks maybe change in weather, seasonal allergies playing a role.  She is not too concerned about it.  Hopefully just a blip in the radar.  HPI initial visit: Patient was in normal state of health about 3 months ago.  Relatively abrupt onset of significant dyspnea on exertion.  Very winded with steps, inclines.  Winded on flat surfaces as well.  No clear inciting event.  She denies viral infection or other infection, trauma etc.  She has been seen by PCP.  Also seen by allergy and immunology.  Received 2 courses of prednisone, course of doxycycline.  She thinks these provided mild intermittent relief but quickly returned to baseline worsening dyspnea shortly after completion.  She reports adherence to high-dose Breo, montelukast, Xolair injections every month.  She is on these injections for many years.  In some ways seems similar to prior asthma exacerbations but in other ways different.  She has CT abdomen pelvis 2019 with no comment on abnormality in the lower part of the lungs.  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 a theatre or a meeting) 0  As a passenger in a car for an hour without a break 2  Lying down to rest in the afternoon when circumstances permit 1  Sitting and talking to someone 0  Sitting quietly after a lunch without alcohol 0  In a car, while stopped for a few minutes in traffic 0  Total score 3    Tests:   FENO:  No results found for: "NITRICOXIDE"  PFT:     No data to display          WALK:      No data to display          Imaging: Personally reviewed as per EMR discussion this note   Lab Results: Personally reviewed and as per EMR CBC  Component Value Date/Time   WBC 8.9 08/25/2022 1323   RBC 4.49 08/25/2022 1323   HGB 13.3 08/25/2022 1323   HCT 40.8 08/25/2022 1323   MCV 91 08/25/2022 1323   MCH 29.6 08/25/2022 1323   MCHC 32.6 08/25/2022 1323   RDW 13.1 08/25/2022 1323   LYMPHSABS 1.8 08/25/2022 1323   EOSABS 0.2 08/25/2022 1323   BASOSABS 0.1 08/25/2022 1323    BMET No results found for: "NA", "K", "CL", "CO2", "GLUCOSE", "BUN", "CREATININE", "CALCIUM", "GFRNONAA", "GFRAA"  BNP No results found for: "BNP"  ProBNP No results found for: "PROBNP"  Specialty Problems       Pulmonary Problems   Severe persistent asthma   OSA (obstructive sleep apnea)    Allergies  Allergen Reactions   Ciprofloxacin Other (See  Comments)    Joint pain   Codeine Other (See Comments)    NERVOUSNESS   Keflex [Cephalexin] Hives   Penicillins Rash   Sulfa Antibiotics Rash    Immunization History  Administered Date(s) Administered   Influenza Inj Mdck Quad Pf 11/21/2016, 11/26/2018   Influenza Split 12/05/2009, 11/18/2011, 11/05/2012, 11/25/2013   Influenza,inj,Quad PF,6+ Mos 10/26/2015, 11/19/2017, 01/04/2021   Influenza-Unspecified 10/26/2015   Moderna Sars-Covid-2 Vaccination 04/07/2019, 05/02/2019   Pneumococcal Polysaccharide-23 07/16/2012    Past Medical History:  Diagnosis Date   Asthma    Hypothyroid    OSA (obstructive sleep apnea) 02/08/2016    Tobacco History: Social History   Tobacco Use  Smoking Status Former   Current packs/day: 0.00   Average packs/day: 0.5 packs/day for 5.0 years (2.5 ttl pk-yrs)   Types: Cigarettes   Start date: 17   Quit date: 1989   Years since quitting: 35.8  Smokeless Tobacco Never   Counseling given: Not Answered    Continue to not smoke  Outpatient Encounter Medications as of 01/09/2023  Medication Sig   albuterol (PROAIR HFA) 108 (90 Base) MCG/ACT inhaler Inhale 2 puffs every 4-6 hours if needed for cough or wheeze.   Ascorbic Acid (VITAMIN C PO) Take by mouth.   AUVI-Q 0.3 MG/0.3ML SOAJ injection Use as directed for life-threatening allergic reaction   Budeson-Glycopyrrol-Formoterol (BREZTRI AEROSPHERE) 160-9-4.8 MCG/ACT AERO Inhale 2 puffs into the lungs in the morning and at bedtime.   fexofenadine (ALLEGRA) 180 MG tablet Take 180 mg by mouth daily as needed for allergies or rhinitis.   KRILL OIL PO Take by mouth daily.   levothyroxine (SYNTHROID, LEVOTHROID) 75 MCG tablet    montelukast (SINGULAIR) 10 MG tablet Take 1 tablet (10 mg total) by mouth daily.   NON FORMULARY CPAP nightly   Tezepelumab-ekko (TEZSPIRE) 210 MG/1. SOAJ Inject 210 mg into the skin every 28 (twenty-eight) days.   venlafaxine XR (EFFEXOR-XR) 150 MG 24 hr capsule Take  150 mg by mouth daily.   Vitamin D, Ergocalciferol, (DRISDOL) 1.25 MG (50000 UT) CAPS capsule    [DISCONTINUED] albuterol (PROAIR HFA) 108 (90 Base) MCG/ACT inhaler Inhale 2 puffs every 4-6 hours if needed for cough or wheeze.   [DISCONTINUED] Budeson-Glycopyrrol-Formoterol (BREZTRI AEROSPHERE) 160-9-4.8 MCG/ACT AERO Inhale 2 puffs into the lungs in the morning and at bedtime.   [DISCONTINUED] doxycycline (VIBRAMYCIN) 100 MG capsule Take 100 mg by mouth daily.   [DISCONTINUED] famotidine (PEPCID) 40 MG tablet Take 1 tablet (40 mg total) by mouth every evening.   [DISCONTINUED] omeprazole (PRILOSEC) 40 MG capsule Take 1 capsule (40 mg total) by mouth in the morning.   [DISCONTINUED] VYVANSE 30 MG capsule Take 30 mg by mouth daily as  needed.   No facility-administered encounter medications on file as of 01/09/2023.     Review of Systems  Review of Systems  N/a  Physical Exam  There were no vitals taken for this visit.  Wt Readings from Last 5 Encounters:  08/25/22 228 lb 3.2 oz (103.5 kg)  08/08/22 229 lb (103.9 kg)  05/09/22 247 lb 3.2 oz (112.1 kg)  10/04/21 244 lb 12.8 oz (111 kg)  04/05/21 223 lb 6.4 oz (101.3 kg)    BMI Readings from Last 5 Encounters:  08/25/22 37.97 kg/m  08/08/22 38.11 kg/m  05/09/22 41.14 kg/m  10/04/21 40.74 kg/m  04/05/21 37.18 kg/m     Physical Exam General: Sitting upright, no acute distress Eyes: EOMI, no icterus Neck: Range of motion appears intact Pulmonary: Normal work of breathing, on room air Neuro: No focal deficits noted Psych: Normal mood, full affect   Assessment & Plan:   Dyspnea on exertion: Initially etiology unclear, felt to be due to uncontrolled asthma that improved with changing inhaler therapy from Breo/Spiriva to [place] HFA.  Chest imaging clear.  TTE reassuring, only grade 1 diastolic dysfunction.    Severe persistent asthma: Had worsening symptoms despite Xolair.  Repeat IgE within normal limits.  Symptoms  improved with change to Breztri from high-dose Breo/Spiriva.  Exacerbation 12/2021 with viral illness.  Had ongoing congestion and worsening symptoms through the spring 2024 prompting addition of thrice weekly azithromycin.  Gradually symptoms have improved and back to baseline.  Continue Breztri and albuterol, both refilled today.  Now on Tezspire as of fall 2024  Return in about 6 months (around 07/09/2023) for f/u Dr. Judeth Horn.   Karren Burly, MD 01/09/2023  Virtual Visit via Video Note  I connected with Andrea Terrell on 01/09/23 at  2:15 PM EST by a video enabled telemedicine application and verified that I am speaking with the correct person using two identifiers.  Location: Patient: Home Provider: Office - [facility] - [address] [place], [address], [place], [place] [address]  I discussed the limitations of evaluation and management by telemedicine and the availability of in person appointments. The patient expressed understanding and agreed to proceed. I also discussed with the patient that there may be a patient responsible charge related to this service. The patient expressed understanding and agreed to proceed.  Patient consented to consult via telephone: Yes People present and their role in pt care: Pt

## 2023-01-30 DIAGNOSIS — Z9889 Other specified postprocedural states: Secondary | ICD-10-CM | POA: Diagnosis not present

## 2023-01-30 DIAGNOSIS — H25811 Combined forms of age-related cataract, right eye: Secondary | ICD-10-CM | POA: Diagnosis not present

## 2023-02-03 ENCOUNTER — Other Ambulatory Visit: Payer: Self-pay

## 2023-02-03 NOTE — Progress Notes (Signed)
Specialty Pharmacy Refill Coordination Note  Andrea Terrell is a 57 y.o. female contacted today regarding refills of specialty medication(s) Tezepelumab-Ekko   Patient requested Delivery   Delivery date: 02/05/23   Verified address: [address] [place] [address]   Medication will be filled on 02/04/23.

## 2023-02-04 ENCOUNTER — Other Ambulatory Visit: Payer: Self-pay

## 2023-02-10 ENCOUNTER — Telehealth: Payer: Self-pay | Admitting: Pulmonary Disease

## 2023-02-10 NOTE — Telephone Encounter (Signed)
Patient is trying to return missed call she can be reached at [phone] or at work at [phone]

## 2023-02-10 NOTE — Telephone Encounter (Signed)
Called and spoke with patient, she is having to use Albuterol inhaler more often, 4 times per day.  She is real tight, dry cough right now.  She feels like something needs to come up.  Started getting worse about a week ago.  She says this happens every fall.  She was told to call if things got worse.  She is sob and has low energy.  She denies any fever, chills or body aches and says she does not feel bad.  She said Dr. Judeth Horn mentioned sending in some prednisone if things got worse.  Advised I would send a message to Dr. Alex Gardener and once we hear back from him we will call her back with his recommendations.  She said it is fine to leave a detailed message on her vm if she does not answer and can also send a mychart message.  Dr. Judeth Horn, please advise.  Thank you.

## 2023-02-10 NOTE — Telephone Encounter (Signed)
ATC x1.  LVM to return call. 

## 2023-02-10 NOTE — Telephone Encounter (Signed)
Patient thinks she may be getting an upper respiratory infection and would like something to be called in since she is not feeling any better from last appointment. Call back number [phone]  Pharmacy: [facility] in [place]

## 2023-02-11 MED ORDER — PREDNISONE 20 MG PO TABS: ORAL_TABLET | ORAL | 0 refills | Status: AC

## 2023-02-11 NOTE — Telephone Encounter (Signed)
Prednisone taper sent.

## 2023-02-11 NOTE — Telephone Encounter (Signed)
Lm x1 for the patient.

## 2023-03-04 ENCOUNTER — Other Ambulatory Visit: Payer: Self-pay

## 2023-03-04 DIAGNOSIS — H2512 Age-related nuclear cataract, left eye: Secondary | ICD-10-CM | POA: Diagnosis not present

## 2023-03-04 NOTE — Progress Notes (Signed)
 Specialty Pharmacy Refill Coordination Note  Andrea Terrell is a 58 y.o. female contacted today regarding refills of specialty medication(s) Tezepelumab -ekko (Tezspire )   Patient requested Delivery   Delivery date: 03/06/23   Verified address: [address]   [place] [address]   Medication will be filled on 03/05/23.

## 2023-03-06 DIAGNOSIS — H25812 Combined forms of age-related cataract, left eye: Secondary | ICD-10-CM | POA: Diagnosis not present

## 2023-03-13 DIAGNOSIS — M79672 Pain in left foot: Secondary | ICD-10-CM | POA: Diagnosis not present

## 2023-03-13 DIAGNOSIS — M79671 Pain in right foot: Secondary | ICD-10-CM | POA: Diagnosis not present

## 2023-03-18 DIAGNOSIS — M79671 Pain in right foot: Secondary | ICD-10-CM | POA: Diagnosis not present

## 2023-03-18 DIAGNOSIS — M79672 Pain in left foot: Secondary | ICD-10-CM | POA: Diagnosis not present

## 2023-03-20 ENCOUNTER — Other Ambulatory Visit (HOSPITAL_COMMUNITY): Payer: Self-pay

## 2023-03-20 DIAGNOSIS — F411 Generalized anxiety disorder: Secondary | ICD-10-CM | POA: Diagnosis not present

## 2023-03-20 DIAGNOSIS — F50819 Binge eating disorder, unspecified: Secondary | ICD-10-CM | POA: Diagnosis not present

## 2023-03-20 DIAGNOSIS — F332 Major depressive disorder, recurrent severe without psychotic features: Secondary | ICD-10-CM | POA: Diagnosis not present

## 2023-03-23 ENCOUNTER — Other Ambulatory Visit: Payer: Self-pay

## 2023-03-25 ENCOUNTER — Other Ambulatory Visit: Payer: Self-pay

## 2023-03-25 NOTE — Progress Notes (Signed)
Specialty Pharmacy Ongoing Clinical Assessment Note  Andrea Terrell is a 58 y.o. female who is being followed by the specialty pharmacy service for RxSp Asthma/COPD   Patient's specialty medication(s) reviewed today: Tezepelumab-ekko (Tezspire)   Missed doses in the last 4 weeks: 0   Patient/Caregiver did not have any additional questions or concerns.   Therapeutic benefit summary: Patient is achieving benefit   Adverse events/side effects summary: No adverse events/side effects   Patient's therapy is appropriate to: Continue    Goals Addressed             This Visit's Progress    Minimize recurrence of flares       Patient is on track. Patient will maintain adherence         Follow up:  6 months  [facility] Specialty Pharmacist

## 2023-03-25 NOTE — Progress Notes (Signed)
Specialty Pharmacy Refill Coordination Note  Andrea Terrell is a 58 y.o. female contacted today regarding refills of specialty medication(s) [name])   Patient requested Delivery   Delivery date: 04/02/23   Verified address: [address]   [place] [address]   Medication will be filled on 02.05.25.

## 2023-03-27 DIAGNOSIS — M79672 Pain in left foot: Secondary | ICD-10-CM | POA: Diagnosis not present

## 2023-03-27 DIAGNOSIS — M79671 Pain in right foot: Secondary | ICD-10-CM | POA: Diagnosis not present

## 2023-04-01 ENCOUNTER — Other Ambulatory Visit: Payer: Self-pay

## 2023-04-03 ENCOUNTER — Other Ambulatory Visit: Payer: Self-pay | Admitting: Internal Medicine

## 2023-04-03 DIAGNOSIS — M79672 Pain in left foot: Secondary | ICD-10-CM | POA: Diagnosis not present

## 2023-04-03 DIAGNOSIS — R7301 Impaired fasting glucose: Secondary | ICD-10-CM | POA: Diagnosis not present

## 2023-04-03 DIAGNOSIS — E039 Hypothyroidism, unspecified: Secondary | ICD-10-CM | POA: Diagnosis not present

## 2023-04-03 DIAGNOSIS — E785 Hyperlipidemia, unspecified: Secondary | ICD-10-CM | POA: Diagnosis not present

## 2023-04-03 DIAGNOSIS — M79671 Pain in right foot: Secondary | ICD-10-CM | POA: Diagnosis not present

## 2023-04-03 DIAGNOSIS — E559 Vitamin D deficiency, unspecified: Secondary | ICD-10-CM | POA: Diagnosis not present

## 2023-04-03 DIAGNOSIS — Z1231 Encounter for screening mammogram for malignant neoplasm of breast: Secondary | ICD-10-CM

## 2023-04-03 DIAGNOSIS — F331 Major depressive disorder, recurrent, moderate: Secondary | ICD-10-CM | POA: Diagnosis not present

## 2023-04-06 DIAGNOSIS — M79672 Pain in left foot: Secondary | ICD-10-CM | POA: Diagnosis not present

## 2023-04-06 DIAGNOSIS — M79671 Pain in right foot: Secondary | ICD-10-CM | POA: Diagnosis not present

## 2023-04-07 ENCOUNTER — Telehealth: Payer: Self-pay

## 2023-04-07 NOTE — Telephone Encounter (Signed)
*  Pulm  Pharmacy Patient Advocate Encounter   Received notification from CoverMyMeds that prior authorization for Breztri Aerosphere 160-9-4.8MCG/ACT aerosol  is required/requested.   Insurance verification completed.   The patient is insured through [facility] .   Per test claim: PA required; PA submitted to above mentioned insurance via CoverMyMeds Key/confirmation #/EOC [id number] Status is pending

## 2023-04-10 ENCOUNTER — Ambulatory Visit: Payer: BC Managed Care – PPO

## 2023-04-10 NOTE — Telephone Encounter (Signed)
No Authorization Required.No Authorization Required.Please note this request does not require prior authorization review. Paid claim on file on 03/16/23.

## 2023-04-17 DIAGNOSIS — J101 Influenza due to other identified influenza virus with other respiratory manifestations: Secondary | ICD-10-CM | POA: Diagnosis not present

## 2023-04-17 DIAGNOSIS — J988 Other specified respiratory disorders: Secondary | ICD-10-CM | POA: Diagnosis not present

## 2023-04-17 DIAGNOSIS — J4521 Mild intermittent asthma with (acute) exacerbation: Secondary | ICD-10-CM | POA: Diagnosis not present

## 2023-04-17 DIAGNOSIS — B9789 Other viral agents as the cause of diseases classified elsewhere: Secondary | ICD-10-CM | POA: Diagnosis not present

## 2023-04-18 DIAGNOSIS — J45901 Unspecified asthma with (acute) exacerbation: Secondary | ICD-10-CM | POA: Diagnosis not present

## 2023-04-18 DIAGNOSIS — Z20822 Contact with and (suspected) exposure to covid-19: Secondary | ICD-10-CM | POA: Diagnosis not present

## 2023-04-18 DIAGNOSIS — J101 Influenza due to other identified influenza virus with other respiratory manifestations: Secondary | ICD-10-CM | POA: Diagnosis not present

## 2023-04-18 DIAGNOSIS — R062 Wheezing: Secondary | ICD-10-CM | POA: Diagnosis not present

## 2023-04-18 DIAGNOSIS — R0602 Shortness of breath: Secondary | ICD-10-CM | POA: Diagnosis not present

## 2023-04-19 DIAGNOSIS — R0602 Shortness of breath: Secondary | ICD-10-CM | POA: Diagnosis not present

## 2023-04-19 DIAGNOSIS — J45901 Unspecified asthma with (acute) exacerbation: Secondary | ICD-10-CM | POA: Diagnosis not present

## 2023-04-22 ENCOUNTER — Encounter: Payer: Self-pay | Admitting: Pulmonary Disease

## 2023-04-22 ENCOUNTER — Ambulatory Visit: Payer: BC Managed Care – PPO | Admitting: Pulmonary Disease

## 2023-04-22 VITALS — BP 130/76 | HR 96 | Temp 98.9°F | Ht 65.0 in | Wt 247.4 lb

## 2023-04-22 DIAGNOSIS — J4551 Severe persistent asthma with (acute) exacerbation: Secondary | ICD-10-CM | POA: Diagnosis not present

## 2023-04-22 MED ORDER — PREDNISONE 10 MG PO TABS: ORAL_TABLET | ORAL | 0 refills | Status: AC

## 2023-04-22 MED ORDER — DOXYCYCLINE HYCLATE 100 MG PO TABS: 100.0000 mg | ORAL_TABLET | Freq: Two times a day (BID) | ORAL | 0 refills | Status: DC

## 2023-04-22 NOTE — Patient Instructions (Signed)
 Sorry you are feeling unwell  Finish the prednisone course that you are previously prescribed.  Then take 20 mg for 7 days then 10 mg for 7 days then stop.  Take doxycycline 1 tablet twice a day for 7 days, if you can take 1 early this afternoon you can take 1 before you go to bed to get 2 doses in today.  Use of DuoNeb, nebulizer solution, 4 times a day for the next day or 2.  Once you start feeling better you can back off to as needed.  If symptoms worsen, if you develop a new fever.  If the cough and congestion worsens, I recommend being seen in the ED.  There is a chance of developing a bacterial pneumonia after the flu.  Hopefully the doxycycline stave this off and you start getting better with time.  Return to clinic in 4 weeks with Dr. Judeth Horn or NP whichever is available for a checkup given severe symptoms yet to get better

## 2023-04-22 NOTE — Progress Notes (Signed)
 @Patient  ID: Andrea Terrell, female    DOB: [birth year], 58 y.o.   MRN: [id number]  Chief Complaint  Patient presents with   Acute Visit    Dx Flu A 04/17/2023 at urgent care [facility]. Rx Tamiflu and prednisone.  Seen at [facility] ED on 04/18/23. Rx steroids, nebulizer with albuterol neb sol. And benzonatate. Dx with asthma exacerbation.    Referring provider: Paulina Fusi, MD  HPI:   58 y.o. woman whom we are seeing in follow-up for dyspnea on exertion and asthma.  Asthma previously well controlled on Xolair.  Worsened symptoms over the summer/fall 2022.  Fortunately great improvement switching Breo/Spiriva to [employer].  TTE obtained which is reassuring.  Xolair changed to [facility] by allergist fall 2024.  Here with acute visit.  Diagnosed with flu last week.  Flu A.  Completed Tamiflu recently.  On prednisone taper.  Got steroid injection and breathing treatment in the ED.  She reports chest x-ray was clear.  Mild improvement with initial therapies in the ED.Marland Kitchen  Discharged with prednisone 40 mg daily for 1 week.  Finishing in the coming days.  Using her DuoNebs 3 times a day.  Mild improvement.  Still a lot of congestion, wheeze.  Cough is no longer productive.  No other fever or chills.  Or worsening concerns.  Just not really getting better.  Lung exam today with wheeze otherwise clear.  HPI initial visit: Patient was in normal state of health about 3 months ago.  Relatively abrupt onset of significant dyspnea on exertion.  Very winded with steps, inclines.  Winded on flat surfaces as well.  No clear inciting event.  She denies viral infection or other infection, trauma etc.  She has been seen by PCP.  Also seen by allergy and immunology.  Received 2 courses of prednisone, course of doxycycline.  She thinks these provided mild intermittent relief but quickly returned to baseline worsening dyspnea shortly after completion.  She reports adherence to high-dose Breo, montelukast,  Xolair injections every month.  She is on these injections for many years.  In some ways seems similar to prior asthma exacerbations but in other ways different.  She has CT abdomen pelvis 2019 with no comment on abnormality in the lower part of the lungs.  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 a theatre or a meeting) 0  As a passenger in a car for an hour without a break 2  Lying down to rest in the afternoon when circumstances permit 1  Sitting and talking to someone 0  Sitting quietly after a lunch without alcohol 0  In a car, while stopped for a few minutes in traffic 0  Total score 3    Tests:   FENO:  No results found for: "  NITRICOXIDE"  PFT:     No data to display          WALK:      No data to display          Imaging: Personally reviewed as per EMR discussion this note   Lab Results: Personally reviewed and as per EMR CBC    Component Value Date/Time   WBC 8.9 08/25/2022 1323   RBC 4.49 08/25/2022 1323   HGB 13.3 08/25/2022 1323   HCT 40.8 08/25/2022 1323   MCV 91 08/25/2022 1323   MCH 29.6 08/25/2022 1323   MCHC 32.6 08/25/2022 1323   RDW 13.1 08/25/2022 1323   LYMPHSABS 1.8 08/25/2022 1323   EOSABS 0.2 08/25/2022 1323   BASOSABS 0.1 08/25/2022 1323    BMET No results found for: "NA", "K", "CL", "CO2", "GLUCOSE",  "BUN", "CREATININE", "CALCIUM", "GFRNONAA", "GFRAA"  BNP No results found for: "BNP"  ProBNP No results found for: "PROBNP"  Specialty Problems       Pulmonary Problems   Severe persistent asthma   OSA (obstructive sleep apnea)    Allergies  Allergen Reactions   Ciprofloxacin Other (See Comments)    Joint pain   Codeine Other (See Comments)    NERVOUSNESS   Keflex [Cephalexin] Hives   Penicillins Rash   Sulfa Antibiotics Rash    Immunization History  Administered Date(s) Administered   Influenza Inj Mdck Quad Pf 11/21/2016, 11/26/2018   Influenza Split 12/05/2009, 11/18/2011, 11/05/2012, 11/25/2013   Influenza,inj,Quad PF,6+ Mos 10/26/2015, 11/19/2017, 01/04/2021   Influenza-Unspecified 10/26/2015   Moderna Sars-Covid-2 Vaccination 04/07/2019, 05/02/2019   Pneumococcal Polysaccharide-23 07/16/2012    Past Medical History:  Diagnosis Date   Asthma    Hypothyroid    OSA (obstructive sleep apnea) 02/08/2016    Tobacco History: Social History   Tobacco Use  Smoking Status Former   Current packs/day: 0.00   Average packs/day: 0.5 packs/day for 5.0 years (2.5 ttl pk-yrs)   Types: Cigarettes   Start date: 36   Quit date: 1989   Years since quitting: 36.1  Smokeless Tobacco Never   Counseling given: Not Answered    Continue to not smoke  Outpatient Encounter Medications as of 04/22/2023  Medication Sig   Ascorbic Acid (VITAMIN C PO) Take by mouth.   atorvastatin (LIPITOR) 20 MG tablet Take 20 mg by mouth at bedtime.   AUVI-Q 0.3 MG/0.3ML SOAJ injection Use as directed for life-threatening allergic reaction   benzonatate (TESSALON) 100 MG capsule Take 100 mg by mouth 3 (three) times daily as needed.   Budeson-Glycopyrrol-Formoterol (BREZTRI AEROSPHERE) 160-9-4.8 MCG/ACT AERO Inhale 2 puffs into the lungs in the morning and at bedtime.   clindamycin (CLEOCIN) 150 MG capsule Take 150 mg by mouth.   doxycycline (VIBRA-TABS) 100 MG tablet Take 1 tablet (100  mg total) by mouth 2 (two) times daily.   fexofenadine (ALLEGRA) 180 MG tablet Take 180 mg by mouth daily as needed for allergies or rhinitis.   ipratropium-albuterol (DUONEB) 0.5-2.5 (3) MG/3ML SOLN Take 3 mLs by nebulization every 6 (six) hours as needed.   KRILL OIL PO Take by mouth daily.   levalbuterol (XOPENEX HFA) 45 MCG/ACT inhaler Inhale 2 puffs into the lungs every 6 (six) hours as needed for wheezing or shortness of breath.   levothyroxine (SYNTHROID, LEVOTHROID) 75 MCG tablet    montelukast (SINGULAIR) 10 MG tablet Take 1 tablet (10 mg total) by mouth daily.   NON FORMULARY CPAP nightly   ondansetron (ZOFRAN-ODT) 4 MG disintegrating  tablet Take 4 mg by mouth every 8 (eight) hours as needed for nausea.   predniSONE (DELTASONE) 10 MG tablet Take 2 tablets (20 mg total) by mouth daily with breakfast for 7 days, THEN 1 tablet (10 mg total) daily with breakfast for 7 days.   Tezepelumab-ekko (TEZSPIRE) 210 MG/1. SOAJ Inject 210 mg into the skin every 28 (twenty-eight) days.   venlafaxine XR (EFFEXOR-XR) 150 MG 24 hr capsule Take 150 mg by mouth daily.   Vitamin D, Ergocalciferol, (DRISDOL) 1.25 MG (50000 UT) CAPS capsule    [DISCONTINUED] albuterol (PROAIR HFA) 108 (90 Base) MCG/ACT inhaler Inhale 2 puffs every 4-6 hours if needed for cough or wheeze.   No facility-administered encounter medications on file as of 04/22/2023.     Review of Systems  Review of Systems  N/a  Physical Exam  BP 130/76 (BP Location: Left Arm, Patient Position: Sitting, Cuff Size: Large)   Pulse 96   Temp 98.9 F (37.2 C) (Oral)   Ht 5\' 5"  (1.651 m)   Wt 247 lb 6.4 oz (112.2 kg)   SpO2 96%   BMI 41.17 kg/m   Wt Readings from Last 5 Encounters:  04/22/23 247 lb 6.4 oz (112.2 kg)  08/25/22 228 lb 3.2 oz (103.5 kg)  08/08/22 229 lb (103.9 kg)  05/09/22 247 lb 3.2 oz (112.1 kg)  10/04/21 244 lb 12.8 oz (111 kg)    BMI Readings from Last 5 Encounters:  04/22/23 41.17 kg/m  08/25/22 37.97  kg/m  08/08/22 38.11 kg/m  05/09/22 41.14 kg/m  10/04/21 40.74 kg/m     Physical Exam General: In no distress, sitting in chair Eyes: EOMI, no icterus Neck: Supple, no JVP Pulmonary: Clear, diffuse mild end expiratory wheeze, normal work of breathing Cardiovascular: Regular rate, regular rhythm, no murmur MSK: No synovitis, joint effusion Neuro: Normal gait, no weakness Psych: Normal mood, full affect   Assessment & Plan:   Dyspnea on exertion: Initially etiology unclear, felt to be due to uncontrolled asthma that improved with changing inhaler therapy from Breo/Spiriva to [place] HFA.  Chest imaging historically clear.  TTE reassuring, only grade 1 diastolic dysfunction.    Severe persistent asthma with acute exacerbation: Had worsening symptoms despite Xolair.  Repeat IgE within normal limits.  Symptoms improved with change to Breztri from high-dose Breo/Spiriva.  Exacerbation 12/2021 with viral illness.  Had ongoing congestion and worsening symptoms through the spring 2024 prompting addition of thrice weekly azithromycin.  Gradually symptoms improved and back to baseline and decision to discontinue azithromycin/2024.  Continue Breztri, Tezspire.  Recommend DuoNebs 4 times a day until symptoms improve.  Status post third injection and completing 7 days of prednisone 40 mg daily, extend prednisone course to 20 mg for 7 days then 10 mg for 7 days and stop.  Doxycycline sent in out of abundance of caution.  Lung exam fortunately clear with the exception of wheeze.  OSA: Good adherence to CPAP.  To continue.  Return in about 4 weeks (around 05/20/2023) for f/u Dr. Judeth Horn.   Karren Burly, MD 04/22/2023

## 2023-04-28 ENCOUNTER — Other Ambulatory Visit (HOSPITAL_COMMUNITY): Payer: Self-pay

## 2023-05-01 ENCOUNTER — Other Ambulatory Visit: Payer: Self-pay

## 2023-05-01 ENCOUNTER — Other Ambulatory Visit (HOSPITAL_COMMUNITY): Payer: Self-pay

## 2023-05-01 ENCOUNTER — Ambulatory Visit
Admission: RE | Admit: 2023-05-01 | Discharge: 2023-05-01 | Disposition: A | Discharge status: Home / Self Care | Payer: BC Managed Care – PPO | Source: Ambulatory Visit | Attending: Internal Medicine | Admitting: Internal Medicine

## 2023-05-01 DIAGNOSIS — Z1231 Encounter for screening mammogram for malignant neoplasm of breast: Secondary | ICD-10-CM | POA: Diagnosis not present

## 2023-05-01 NOTE — Progress Notes (Signed)
 Specialty Pharmacy Refill Coordination Note  Andrea Terrell is a 58 y.o. female contacted today regarding refills of specialty medication(s) [name])   Patient requested Delivery   Delivery date: 05/05/23   Verified address: [address]   [place] [address]   Medication will be filled on 05/04/23.

## 2023-05-13 ENCOUNTER — Other Ambulatory Visit: Payer: Self-pay | Admitting: Allergy and Immunology

## 2023-05-19 DIAGNOSIS — M79672 Pain in left foot: Secondary | ICD-10-CM | POA: Diagnosis not present

## 2023-05-19 DIAGNOSIS — M79671 Pain in right foot: Secondary | ICD-10-CM | POA: Diagnosis not present

## 2023-05-26 ENCOUNTER — Other Ambulatory Visit (HOSPITAL_COMMUNITY): Payer: Self-pay

## 2023-05-26 ENCOUNTER — Other Ambulatory Visit: Payer: Self-pay

## 2023-05-26 NOTE — Progress Notes (Signed)
 Specialty Pharmacy Refill Coordination Note  Andrea Terrell is a 58 y.o. female contacted today regarding refills of specialty medication(s) Tezspire.  Patient requested (Patient-Rptd) Delivery   Delivery date: (Patient-Rptd) 05/29/23   Verified address: (Patient-Rptd) [address] [place] [address]   Medication will be filled on 05/28/23.

## 2023-05-28 ENCOUNTER — Other Ambulatory Visit: Payer: Self-pay

## 2023-06-04 ENCOUNTER — Encounter: Payer: Self-pay | Admitting: Pulmonary Disease

## 2023-06-04 ENCOUNTER — Ambulatory Visit: Admitting: Pulmonary Disease

## 2023-06-04 DIAGNOSIS — J4550 Severe persistent asthma, uncomplicated: Secondary | ICD-10-CM

## 2023-06-04 MED ORDER — BREZTRI AEROSPHERE 160-9-4.8 MCG/ACT IN AERO: 2.0000 | INHALATION_SPRAY | Freq: Two times a day (BID) | RESPIRATORY_TRACT | 12 refills | Status: DC

## 2023-06-04 NOTE — Patient Instructions (Signed)
 Nice to see you again  No change in the medication today  I wanted refill of the [facility] - you should have plenty moving forward  Your CPAP use looks looks excellent, please continue using this.  Return to clinic in 6 months or sooner as needed with Dr. Judeth Horn

## 2023-06-04 NOTE — Progress Notes (Signed)
 @Patient  ID: Andrea Terrell, female    DOB: 11-09-1965, 58 y.o.   MRN: [id number]  Chief Complaint  Patient presents with   Follow-up    Pt states still some wheezing     Referring provider: Paulina Fusi, MD  HPI:   58 y.o. woman whom we are seeing in follow-up for dyspnea on exertion and asthma.  Asthma previously well controlled on Xolair.  Worsened symptoms over the summer/fall 2022.  Fortunately great improvement switching Breo/Spiriva to [employer].  TTE obtained which is reassuring.  Xolair changed to Tezspire by allergist fall 2024.  Recent acute visit following influenza infection.  Sent with prednisone taper.  As well as doxycycline.  Marked improvement.  A bit of wheeze from time to time she states.  Overall she looks much better.  She is feeling better.  With tolerance some watery eyes itchy throat.  No longer have the use nebulizer.  Using albuterol at least.  Discussed likely related to increased pollen plates.  HPI initial visit: Patient was in normal state of health about 3 months ago.  Relatively abrupt onset of significant dyspnea on exertion.  Very winded with steps, inclines.  Winded on flat surfaces as well.  No clear inciting event.  She denies viral infection or other infection, trauma etc.  She has been seen by PCP.  Also seen by allergy and immunology.  Received 2 courses of prednisone, course of doxycycline.  She thinks these provided mild intermittent relief but quickly returned to baseline worsening dyspnea shortly after completion.  She reports adherence to high-dose Breo, montelukast, Xolair injections every month.  She is on these injections for many years.  In some ways seems similar to prior asthma exacerbations but in other ways different.  She has CT abdomen pelvis 2019 with no comment on abnormality in the lower part of the lungs.  PMH: Asthma, seasonal allergies, Surgical history: Hernia repair, adenoidectomy, tonsillectomy, tubal ligation Family  history: Mother COPD, CAD, father with hypertension Social history: Minimal smoking history, approximate 2 pack years while in college, works at [employer], sport and exercise at [employer] development [employer], lives in [place]   Questionaires / Pulmonary Flowsheets:   ACT:  Asthma Control Test ACT Total Score  08/08/2022  9:30 AM 22  10/04/2021  9:01 AM 22  04/05/2021  9:03 AM 21    MMRC:     No data to display          Epworth:     01/04/2016    1:00 PM  Results of the Epworth flowsheet  Sitting and reading 0  Watching TV 0  Sitting, inactive in a public place (e.g. a theatre or a meeting) 0  As a passenger in a car for an hour without a break 2  Lying down to rest in the afternoon when circumstances permit 1  Sitting and talking to someone 0  Sitting quietly after a lunch without alcohol 0  In a car, while stopped for a few minutes in traffic 0  Total score 3    Tests:   FENO:  No results found for: "NITRICOXIDE"  PFT:     No data to display          WALK:      No data to display          Imaging: Personally reviewed as per EMR discussion this note   Lab Results: Personally reviewed and as per EMR CBC    Component Value Date/Time   WBC 8.9 08/25/2022  1323   RBC 4.49 08/25/2022 1323   HGB 13.3 08/25/2022 1323   HCT 40.8 08/25/2022 1323   MCV 91 08/25/2022 1323   MCH 29.6 08/25/2022 1323   MCHC 32.6 08/25/2022 1323   RDW 13.1 08/25/2022 1323   LYMPHSABS 1.8 08/25/2022 1323   EOSABS 0.2 08/25/2022 1323   BASOSABS 0.1 08/25/2022 1323    BMET No results found for: "NA", "K", "CL", "CO2", "GLUCOSE", "BUN", "CREATININE", "CALCIUM", "GFRNONAA", "GFRAA"  BNP No results found for: "BNP"  ProBNP No results found for: "PROBNP"  Specialty Problems       Pulmonary Problems   Severe persistent asthma   OSA (obstructive sleep apnea)    Allergies  Allergen Reactions   Ciprofloxacin Other (See Comments)    Joint pain   Codeine Other (See Comments)    NERVOUSNESS    Keflex [Cephalexin] Hives   Penicillins Rash   Sulfa Antibiotics Rash    Immunization History  Administered Date(s) Administered   Influenza Inj Mdck Quad Pf 11/21/2016, 11/26/2018   Influenza Split 12/05/2009, 11/18/2011, 11/05/2012, 11/25/2013   Influenza,inj,Quad PF,6+ Mos 10/26/2015, 11/19/2017, 01/04/2021   Influenza-Unspecified 10/26/2015   Moderna Sars-Covid-2 Vaccination 04/07/2019, 05/02/2019   Pneumococcal Polysaccharide-23 07/16/2012    Past Medical History:  Diagnosis Date   Asthma    Hypothyroid    OSA (obstructive sleep apnea) 02/08/2016    Tobacco History: Social History   Tobacco Use  Smoking Status Former   Current packs/day: 0.00   Average packs/day: 0.5 packs/day for 5.0 years (2.5 ttl pk-yrs)   Types: Cigarettes   Start date: 81   Quit date: 1989   Years since quitting: 36.2  Smokeless Tobacco Never   Counseling given: Not Answered    Continue to not smoke  Outpatient Encounter Medications as of 06/04/2023  Medication Sig   Ascorbic Acid (VITAMIN C PO) Take by mouth.   atorvastatin (LIPITOR) 20 MG tablet Take 20 mg by mouth at bedtime.   AUVI-Q 0.3 MG/0.3ML SOAJ injection Use as directed for life-threatening allergic reaction   fexofenadine (ALLEGRA) 180 MG tablet Take 180 mg by mouth daily as needed for allergies or rhinitis.   ipratropium-albuterol (DUONEB) 0.5-2.5 (3) MG/3ML SOLN Take 3 mLs by nebulization every 6 (six) hours as needed.   KRILL OIL PO Take by mouth daily.   levalbuterol (XOPENEX HFA) 45 MCG/ACT inhaler Inhale 2 puffs into the lungs every 6 (six) hours as needed for wheezing or shortness of breath.   levothyroxine (SYNTHROID, LEVOTHROID) 75 MCG tablet    montelukast (SINGULAIR) 10 MG tablet Take 1 tablet by mouth once daily.   NON FORMULARY CPAP nightly   Tezepelumab-ekko (TEZSPIRE) 210 MG/1. SOAJ Inject 210 mg into the skin every 28 (twenty-eight) days.   venlafaxine XR (EFFEXOR-XR) 150 MG 24 hr capsule Take 150 mg by  mouth daily.   Vitamin D, Ergocalciferol, (DRISDOL) 1.25 MG (50000 UT) CAPS capsule    [DISCONTINUED] Budeson-Glycopyrrol-Formoterol (BREZTRI AEROSPHERE) 160-9-4.8 MCG/ACT AERO Inhale 2 puffs into the lungs in the morning and at bedtime.   budeson-glycopyrrolate-formoterol (BREZTRI AEROSPHERE) 160-9-4.8 MCG/ACT AERO inhaler Inhale 2 puffs into the lungs in the morning and at bedtime.   [DISCONTINUED] benzonatate (TESSALON) 100 MG capsule Take 100 mg by mouth 3 (three) times daily as needed.   [DISCONTINUED] clindamycin (CLEOCIN) 150 MG capsule Take 150 mg by mouth.   [DISCONTINUED] doxycycline (VIBRA-TABS) 100 MG tablet Take 1 tablet (100 mg total) by mouth 2 (two) times daily.   [DISCONTINUED] ondansetron (ZOFRAN-ODT) 4 MG disintegrating  tablet Take 4 mg by mouth every 8 (eight) hours as needed for nausea.   No facility-administered encounter medications on file as of 06/04/2023.     Review of Systems  Review of Systems  N/a  Physical Exam  BP 135/89 (BP Location: Left Arm, Patient Position: Sitting, Cuff Size: Normal)   Pulse 83   Ht 5\' 5"  (1.651 m)   Wt 260 lb (117.9 kg)   SpO2 98%   BMI 43.27 kg/m   Wt Readings from Last 5 Encounters:  06/04/23 260 lb (117.9 kg)  04/22/23 247 lb 6.4 oz (112.2 kg)  08/25/22 228 lb 3.2 oz (103.5 kg)  08/08/22 229 lb (103.9 kg)  05/09/22 247 lb 3.2 oz (112.1 kg)    BMI Readings from Last 5 Encounters:  06/04/23 43.27 kg/m  04/22/23 41.17 kg/m  08/25/22 37.97 kg/m  08/08/22 38.11 kg/m  05/09/22 41.14 kg/m     Physical Exam General: In no distress, sitting in chair Eyes: EOMI, no icterus Neck: Supple, no JVP Pulmonary: Clear, normal work of breathing Cardiovascular: Regular rate, regular rhythm, no murmur MSK: No synovitis, joint effusion Neuro: Normal gait, no weakness Psych: Normal mood, full affect   Assessment & Plan:   Dyspnea on exertion: Initially etiology unclear, felt to be due to uncontrolled asthma that improved  with changing inhaler therapy from Breo/Spiriva to [place] HFA.  Chest imaging historically clear.  TTE reassuring, only grade 1 diastolic dysfunction.    Severe persistent asthma: Had worsening symptoms despite Xolair.  Repeat IgE within normal limits.  Symptoms improved with change to Breztri from high-dose Breo/Spiriva.  Exacerbation 12/2021 with viral illness.  Had ongoing congestion and worsening symptoms through the spring 2024 prompting addition of thrice weekly azithromycin.  Gradually symptoms improved and back to baseline and decision to discontinue azithromycin later in 2024.  Following exacerbation early 2025 following influenza infection.  Improved from that.  Continue Breztri (refilled today), Tezspire.  Continue albuterol as needed.  OSA: Compliance report reviewed today.  Good adherence to CPAP.  AHI 1.6.  She is clinically benefiting from CPAP.  Encouraged her to continue.  Return in about 6 months (around 12/04/2023) for f/u Dr. Judeth Horn.   Karren Burly, MD 06/04/2023

## 2023-06-05 DIAGNOSIS — L918 Other hypertrophic disorders of the skin: Secondary | ICD-10-CM | POA: Diagnosis not present

## 2023-06-05 DIAGNOSIS — L821 Other seborrheic keratosis: Secondary | ICD-10-CM | POA: Diagnosis not present

## 2023-06-05 DIAGNOSIS — B079 Viral wart, unspecified: Secondary | ICD-10-CM | POA: Diagnosis not present

## 2023-06-10 ENCOUNTER — Encounter: Payer: Self-pay | Admitting: Allergy and Immunology

## 2023-06-10 ENCOUNTER — Ambulatory Visit: Admitting: Allergy and Immunology

## 2023-06-10 VITALS — BP 138/88 | HR 106 | Resp 18

## 2023-06-10 DIAGNOSIS — J4550 Severe persistent asthma, uncomplicated: Secondary | ICD-10-CM

## 2023-06-10 DIAGNOSIS — J3089 Other allergic rhinitis: Secondary | ICD-10-CM

## 2023-06-10 MED ORDER — MONTELUKAST SODIUM 10 MG PO TABS: 10.0000 mg | ORAL_TABLET | Freq: Every day | ORAL | 1 refills | Status: DC

## 2023-06-10 MED ORDER — AIRSUPRA 90-80 MCG/ACT IN AERO: 2.0000 | INHALATION_SPRAY | Freq: Four times a day (QID) | RESPIRATORY_TRACT | 1 refills | Status: DC | PRN

## 2023-06-10 NOTE — Progress Notes (Unsigned)
 [place] - [place] - [place] - [place] - [place]   Follow-up Note  Referring Provider: Paulina Fusi, MD Primary Provider: Paulina Fusi, MD Date of Office Visit: 06/10/2023  Subjective:   Andrea Terrell (DOB: March 07, 1965) is a 58 y.o. female who [facility] in re-evaluation of the following:  HPI: Andrea Terrell returns to this clinic in evaluation of asthma, allergic rhinitis, LPR.  I last saw her in this clinic in August 2024.  She unfortunately contracted influenza infection with rather significant lower respiratory tract symptoms February 2025 treated with Tamiflu and subsequently a steroid and an antibiotic.  Fortunately she appears to have resolved that issue.  Other than that single event she has really done well since have last seen in this clinic and she has not required additional systemic steroid antibiotic and rarely uses a short acting bronchodilator and has had very little issues with her upper airway while she consistently uses tezepelumab injections and Breztri and montelukast and occasionally some nasal steroids.  Her reflux and LPR issue is for the most part completely resolved and she does not use any therapy for this issue at this point.  Allergies as of 06/10/2023       Reactions   Ciprofloxacin Other (See Comments)   Joint pain   Codeine Other (See Comments)   NERVOUSNESS   Keflex [cephalexin] Hives   Penicillins Rash   Sulfa Antibiotics Rash        Medication List    atorvastatin 20 MG tablet Commonly known as: LIPITOR Take 20 mg by mouth at bedtime.   Auvi-Q 0.3 MG/0.3ML Soaj injection Generic drug: EPINEPHrine Use as directed for life-threatening allergic reaction   Breztri Aerosphere 160-9-4.8 MCG/ACT Aero inhaler Generic drug: budeson-glycopyrrolate-formoterol Inhale 2 puffs into the lungs in the morning and at bedtime.   fexofenadine 180 MG tablet Commonly known as: ALLEGRA Take  180 mg by mouth daily as needed for allergies or rhinitis.   ipratropium-albuterol 0.5-2.5 (3) MG/3ML Soln Commonly known as: DUONEB Take 3 mLs by nebulization every 6 (six) hours as needed.   KRILL OIL PO Take by mouth daily.   levalbuterol 45 MCG/ACT inhaler Commonly known as: XOPENEX HFA Inhale 2 puffs into the lungs every 6 (six) hours as needed for wheezing or shortness of breath.   levothyroxine 75 MCG tablet Commonly known as: SYNTHROID   montelukast 10 MG tablet Commonly known as: SINGULAIR Take 1 tablet (10 mg total) by mouth daily.   NON FORMULARY CPAP nightly   Tezspire 210 MG/1. Soaj Generic drug: Tezepelumab-ekko Inject 210 mg into the skin every 28 (twenty-eight) days.   venlafaxine XR 150 MG 24 hr capsule Commonly known as: EFFEXOR-XR Take 150 mg by mouth daily.   VITAMIN C PO Take by mouth.   Vitamin D (Ergocalciferol) 1.25 MG (50000 UNIT) Caps capsule Commonly known as: DRISDOL    Past Medical History:  Diagnosis Date   Asthma    Hypothyroid    OSA (obstructive sleep apnea) 02/08/2016    Past Surgical History:  Procedure Laterality Date   ABDOMINAL HERNIA REPAIR     age 30   ADENOIDECTOMY     CERVICAL ABLATION  2006   FINGER SURGERY     age 33   REDUCTION MAMMAPLASTY Left    1995?   TONSILLECTOMY     TUBAL LIGATION  1996    Review of systems negative except as noted in HPI / PMHx or noted below:  Review of Systems  Constitutional: Negative.   HENT: Negative.    Eyes: Negative.   Respiratory: Negative.    Cardiovascular: Negative.   Gastrointestinal: Negative.   Genitourinary: Negative.   Musculoskeletal: Negative.   Skin: Negative.   Neurological: Negative.   Endo/Heme/Allergies: Negative.   Psychiatric/Behavioral: Negative.       Objective:   Vitals:   06/10/23 1554  BP: 138/88  Pulse: (!) 106  Resp: 18  SpO2: 97%          Physical Exam Constitutional:      Appearance: She is not diaphoretic.  HENT:      Head: Normocephalic.     Right Ear: Tympanic membrane, ear canal and external ear normal.     Left Ear: Tympanic membrane, ear canal and external ear normal.     Nose: Nose normal. No mucosal edema or rhinorrhea.     Mouth/Throat:     Pharynx: Uvula midline. No oropharyngeal exudate.  Eyes:     Conjunctiva/sclera: Conjunctivae normal.  Neck:     Thyroid: No thyromegaly.     Trachea: Trachea normal. No tracheal tenderness or tracheal deviation.  Cardiovascular:     Rate and Rhythm: Normal rate and regular rhythm.     Heart sounds: Normal heart sounds, S1 normal and S2 normal. No murmur heard. Pulmonary:     Effort: No respiratory distress.     Breath sounds: Normal breath sounds. No stridor. No wheezing or rales.  Lymphadenopathy:     Head:     Right side of head: No tonsillar adenopathy.     Left side of head: No tonsillar adenopathy.     Cervical: No cervical adenopathy.  Skin:    Findings: No erythema or rash.     Nails: There is no clubbing.  Neurological:     Mental Status: She is alert.     Diagnostics: Spirometry was performed and demonstrated an FEV1 of 2.15 at 81 % of predicted.  Assessment and Plan:   1. Asthma, severe persistent, well-controlled   2. Perennial allergic rhinitis    1. Continue to treat and prevent inflammation:  A. Breztri - 2 inhalations 2 times per day B. Montelukast 10 mg tablet 1 time per day C. OTC Rhinocort / budesonide - 1-2 sprays each nostril 1 time per day D. Tezepelumab injections  2. If needed:   A. OTC Allegra 180 - 1 tablet 1-2 time per day  B. AIRSUPRA -  2 inhalations every 4-6 hour (replaces albuterol)  C. OTC Pataday - 1 drop each eye 1 time per day  3. Return to clinic in 6 months or earlier if problem  4. Influenza = Tamiflu. Covid = Paxlovid  Andrea Terrell appears to be doing pretty well at this point in time while using anti-TSLP antibody in conjunction with other anti-inflammatory agents for her airway as noted above to  control her atopic respiratory disease.  She will continue on this plan and she has a selection of agents to utilize should they be required as noted above.  I will see her back in this clinic in 6 months or earlier if there is a problem.  Andrea Custard, MD Allergy / Immunology [facility]

## 2023-06-10 NOTE — Patient Instructions (Signed)
  1. Continue to treat and prevent inflammation:  A. Breztri - 2 inhalations 2 times per day B. Montelukast 10 mg tablet 1 time per day C. OTC Rhinocort / budesonide - 1-2 sprays each nostril 1 time per day D. Tezepelumab injections  2. If needed:   A. OTC Allegra 180 - 1 tablet 1-2 time per day  B. AIRSUPRA -  2 inhalations every 4-6 hour (replaces albuterol)  C. OTC Pataday - 1 drop each eye 1 time per day  3. Return to clinic in 6 months or earlier if problem  4. Influenza = Tamiflu. Covid = Paxlovid

## 2023-06-11 ENCOUNTER — Encounter: Payer: Self-pay | Admitting: Allergy and Immunology

## 2023-06-11 DIAGNOSIS — F332 Major depressive disorder, recurrent severe without psychotic features: Secondary | ICD-10-CM | POA: Diagnosis not present

## 2023-06-11 DIAGNOSIS — F50819 Binge eating disorder, unspecified: Secondary | ICD-10-CM | POA: Diagnosis not present

## 2023-06-11 DIAGNOSIS — F411 Generalized anxiety disorder: Secondary | ICD-10-CM | POA: Diagnosis not present

## 2023-06-23 ENCOUNTER — Other Ambulatory Visit: Payer: Self-pay | Admitting: Pharmacy Technician

## 2023-06-23 ENCOUNTER — Other Ambulatory Visit: Payer: Self-pay

## 2023-06-23 NOTE — Progress Notes (Signed)
 Specialty Pharmacy Refill Coordination Note  Andrea Terrell is a 58 y.o. female contacted today regarding refills of specialty medication(s) Tezepelumab -ekko (Tezspire )   Patient requested Delivery   Delivery date: 06/26/23   Verified address: [address] [place] [address]   Medication will be filled on 06/25/23.

## 2023-06-25 ENCOUNTER — Other Ambulatory Visit: Payer: Self-pay

## 2023-07-21 ENCOUNTER — Other Ambulatory Visit (HOSPITAL_COMMUNITY): Payer: Self-pay

## 2023-07-22 ENCOUNTER — Other Ambulatory Visit (HOSPITAL_COMMUNITY): Payer: Self-pay

## 2023-07-22 ENCOUNTER — Other Ambulatory Visit: Payer: Self-pay

## 2023-07-22 NOTE — Progress Notes (Signed)
 Specialty Pharmacy Refill Coordination Note  Andrea Terrell is a 58 y.o. female contacted today regarding refills of specialty medication(s) Tezspire .  Patient requested (Patient-Rptd) Delivery   Delivery date: (Patient-Rptd) 07/24/23   Verified address: (Patient-Rptd) [address] [place] [address]   Medication will be filled on 07/23/23.

## 2023-07-23 ENCOUNTER — Other Ambulatory Visit: Payer: Self-pay

## 2023-08-06 DIAGNOSIS — L650 Telogen effluvium: Secondary | ICD-10-CM | POA: Diagnosis not present

## 2023-08-06 DIAGNOSIS — L918 Other hypertrophic disorders of the skin: Secondary | ICD-10-CM | POA: Diagnosis not present

## 2023-08-06 DIAGNOSIS — D485 Neoplasm of uncertain behavior of skin: Secondary | ICD-10-CM | POA: Diagnosis not present

## 2023-08-21 DIAGNOSIS — F332 Major depressive disorder, recurrent severe without psychotic features: Secondary | ICD-10-CM | POA: Diagnosis not present

## 2023-08-21 DIAGNOSIS — F411 Generalized anxiety disorder: Secondary | ICD-10-CM | POA: Diagnosis not present

## 2023-08-24 ENCOUNTER — Other Ambulatory Visit: Payer: Self-pay

## 2023-08-24 ENCOUNTER — Other Ambulatory Visit (HOSPITAL_COMMUNITY): Payer: Self-pay

## 2023-08-24 NOTE — Progress Notes (Signed)
 Specialty Pharmacy Refill Coordination Note  Spoke with War[place], Kysa S (Self)   MAHIRA PETRAS is a 58 y.o. female contacted today regarding refills of specialty medication(s) Tezepelumab -ekko (Tezspire )  Injection date: 08/27/23.   Patient requested: Delivery   Delivery date: 08/26/23   Verified address: [address]  [place]  [address]  Medication will be filled on 08/25/23.

## 2023-09-15 ENCOUNTER — Encounter (INDEPENDENT_AMBULATORY_CARE_PROVIDER_SITE_OTHER): Payer: Self-pay

## 2023-09-15 ENCOUNTER — Other Ambulatory Visit: Payer: Self-pay

## 2023-09-15 ENCOUNTER — Other Ambulatory Visit: Payer: Self-pay | Admitting: Allergy and Immunology

## 2023-09-15 MED ORDER — TEZSPIRE 210 MG/1.91ML ~~LOC~~ SOAJ
210.0000 mg | SUBCUTANEOUS | 11 refills | Status: AC
  Filled 2023-09-15 – 2023-09-23 (×3): qty 1.91, 28d supply, fill #0
  Filled 2023-10-13: qty 1.91, 28d supply, fill #1
  Filled 2023-11-05 – 2023-11-12 (×2): qty 1.91, 28d supply, fill #2
  Filled 2023-12-09: qty 1.91, 28d supply, fill #3
  Filled 2024-01-06: qty 1.91, 28d supply, fill #4
  Filled 2024-02-01 – 2024-02-03 (×2): qty 1.91, 28d supply, fill #5
  Filled 2024-02-29 – 2024-03-02 (×2): qty 1.91, 28d supply, fill #6
  Filled 2024-03-28: qty 1.91, 28d supply, fill #7

## 2023-09-16 ENCOUNTER — Other Ambulatory Visit: Payer: Self-pay

## 2023-09-17 ENCOUNTER — Telehealth: Payer: Self-pay

## 2023-09-17 ENCOUNTER — Other Ambulatory Visit: Payer: Self-pay

## 2023-09-17 NOTE — Telephone Encounter (Signed)
 PA needed for Tezspire.

## 2023-09-18 ENCOUNTER — Other Ambulatory Visit: Payer: Self-pay

## 2023-09-22 ENCOUNTER — Other Ambulatory Visit: Payer: Self-pay

## 2023-09-22 NOTE — Progress Notes (Signed)
 7.29 -message Juliana through Teams inquiring PA status.  Per Vick: Tammy is still working on it - probably having to go through an appeal process and sometimes that can take longer.  Re-time patient to planned date on 7/31- check chart before refill & need to call pt for medication delayed  due to PA.

## 2023-09-23 ENCOUNTER — Other Ambulatory Visit: Payer: Self-pay

## 2023-09-23 ENCOUNTER — Other Ambulatory Visit: Payer: Self-pay | Admitting: Pharmacy Technician

## 2023-09-23 NOTE — Progress Notes (Signed)
 Specialty Pharmacy Refill Coordination Note  Andrea Terrell is a 58 y.o. female contacted today regarding refills of specialty medication(s) Tezepelumab -ekko (Tezspire )   Patient requested Delivery   Delivery date: 09/24/23   Verified address: [address] [place] [address]   Medication will be filled on 09/23/23.  Called to inform patient of new delivery date of 7/31. PA got approved today 7/30.

## 2023-09-23 NOTE — Telephone Encounter (Signed)
 Approved via Tammy- message sent to [facility]

## 2023-10-02 DIAGNOSIS — Z0000 Encounter for general adult medical examination without abnormal findings: Secondary | ICD-10-CM | POA: Diagnosis not present

## 2023-10-09 ENCOUNTER — Other Ambulatory Visit (HOSPITAL_COMMUNITY): Payer: Self-pay

## 2023-10-12 ENCOUNTER — Encounter (INDEPENDENT_AMBULATORY_CARE_PROVIDER_SITE_OTHER): Payer: Self-pay

## 2023-10-13 ENCOUNTER — Other Ambulatory Visit: Payer: Self-pay

## 2023-10-13 NOTE — Progress Notes (Signed)
 Specialty Pharmacy Refill Coordination Note  Andrea Terrell is a 58 y.o. female contacted today regarding refills of specialty medication(s) Tezepelumab -ekko (Tezspire )   Patient requested (Patient-Rptd) Delivery   Delivery date: 10/16/23   Verified address: (Patient-Rptd) [address] [place] [address]   Medication will be filled on 08.21.25.

## 2023-10-14 DIAGNOSIS — Z1212 Encounter for screening for malignant neoplasm of rectum: Secondary | ICD-10-CM | POA: Diagnosis not present

## 2023-10-14 DIAGNOSIS — Z1211 Encounter for screening for malignant neoplasm of colon: Secondary | ICD-10-CM | POA: Diagnosis not present

## 2023-10-22 DIAGNOSIS — G4730 Sleep apnea, unspecified: Secondary | ICD-10-CM | POA: Diagnosis not present

## 2023-10-22 DIAGNOSIS — Z961 Presence of intraocular lens: Secondary | ICD-10-CM | POA: Diagnosis not present

## 2023-10-22 DIAGNOSIS — H35411 Lattice degeneration of retina, right eye: Secondary | ICD-10-CM | POA: Diagnosis not present

## 2023-10-22 DIAGNOSIS — H04123 Dry eye syndrome of bilateral lacrimal glands: Secondary | ICD-10-CM | POA: Diagnosis not present

## 2023-10-23 DIAGNOSIS — F4323 Adjustment disorder with mixed anxiety and depressed mood: Secondary | ICD-10-CM | POA: Diagnosis not present

## 2023-11-05 ENCOUNTER — Other Ambulatory Visit: Payer: Self-pay

## 2023-11-11 ENCOUNTER — Other Ambulatory Visit (HOSPITAL_COMMUNITY): Payer: Self-pay

## 2023-11-11 ENCOUNTER — Encounter (INDEPENDENT_AMBULATORY_CARE_PROVIDER_SITE_OTHER): Payer: Self-pay

## 2023-11-12 ENCOUNTER — Other Ambulatory Visit: Payer: Self-pay

## 2023-11-12 ENCOUNTER — Other Ambulatory Visit: Payer: Self-pay | Admitting: Pharmacy Technician

## 2023-11-12 NOTE — Progress Notes (Signed)
 Specialty Pharmacy Refill Coordination Note  Andrea Terrell is a 58 y.o. female contacted today regarding refills of specialty medication(s) Tezepelumab -ekko (Tezspire )   Patient requested Delivery   Delivery date: 11/17/23   Verified address: [address] [place] [address]   Medication will be filled on 11/16/23. Injection due: 11/19/23. Answered questionnaire.

## 2023-11-13 DIAGNOSIS — F50819 Binge eating disorder, unspecified: Secondary | ICD-10-CM | POA: Diagnosis not present

## 2023-11-13 DIAGNOSIS — F411 Generalized anxiety disorder: Secondary | ICD-10-CM | POA: Diagnosis not present

## 2023-11-13 DIAGNOSIS — F332 Major depressive disorder, recurrent severe without psychotic features: Secondary | ICD-10-CM | POA: Diagnosis not present

## 2023-11-13 DIAGNOSIS — F4323 Adjustment disorder with mixed anxiety and depressed mood: Secondary | ICD-10-CM | POA: Diagnosis not present

## 2023-12-07 ENCOUNTER — Other Ambulatory Visit: Payer: Self-pay

## 2023-12-07 MED ORDER — MONTELUKAST SODIUM 10 MG PO TABS: 10.0000 mg | ORAL_TABLET | Freq: Every day | ORAL | 0 refills | Status: DC

## 2023-12-09 ENCOUNTER — Other Ambulatory Visit: Payer: Self-pay

## 2023-12-09 NOTE — Progress Notes (Signed)
 Specialty Pharmacy Refill Coordination Note  Andrea Terrell is a 58 y.o. female contacted today regarding refills of specialty medication(s) Tezepelumab -ekko (Tezspire )   Patient requested Delivery   Delivery date: 12/11/23   Verified address: [address] [place] [address]   Medication will be filled on 12/10/23.

## 2023-12-09 NOTE — Progress Notes (Signed)
 Specialty Pharmacy Ongoing Clinical Assessment Note  Andrea Terrell is a 57 y.o. female who is being followed by the specialty pharmacy service for RxSp Asthma/COPD   Patient's specialty medication(s) reviewed today: Tezepelumab -ekko (Tezspire )   Missed doses in the last 4 weeks: 0   Patient/Caregiver did not have any additional questions or concerns.   Therapeutic benefit summary: Patient is achieving benefit   Adverse events/side effects summary: No adverse events/side effects   Patient's therapy is appropriate to: Continue    Goals Addressed             This Visit's Progress    Minimize recurrence of flares   On track    Patient is on track. Patient will maintain adherence         Follow up: 12 months  Meldrick Buttery M Brandan Glauber Specialty Pharmacist

## 2023-12-16 DIAGNOSIS — F4323 Adjustment disorder with mixed anxiety and depressed mood: Secondary | ICD-10-CM | POA: Diagnosis not present

## 2023-12-16 DIAGNOSIS — Z01818 Encounter for other preprocedural examination: Secondary | ICD-10-CM | POA: Diagnosis not present

## 2023-12-24 ENCOUNTER — Ambulatory Visit: Admitting: Allergy and Immunology

## 2024-01-06 ENCOUNTER — Other Ambulatory Visit: Payer: Self-pay

## 2024-01-06 ENCOUNTER — Encounter (INDEPENDENT_AMBULATORY_CARE_PROVIDER_SITE_OTHER): Payer: Self-pay

## 2024-01-07 ENCOUNTER — Other Ambulatory Visit (HOSPITAL_COMMUNITY): Payer: Self-pay

## 2024-01-07 ENCOUNTER — Ambulatory Visit: Admitting: Allergy and Immunology

## 2024-01-07 ENCOUNTER — Other Ambulatory Visit: Payer: Self-pay

## 2024-01-07 ENCOUNTER — Ambulatory Visit: Admitting: Pulmonary Disease

## 2024-01-07 ENCOUNTER — Encounter: Payer: Self-pay | Admitting: Pulmonary Disease

## 2024-01-07 ENCOUNTER — Encounter: Payer: Self-pay | Admitting: Allergy and Immunology

## 2024-01-07 VITALS — BP 126/84 | HR 88 | Resp 16

## 2024-01-07 DIAGNOSIS — J4550 Severe persistent asthma, uncomplicated: Secondary | ICD-10-CM | POA: Diagnosis not present

## 2024-01-07 DIAGNOSIS — J3089 Other allergic rhinitis: Secondary | ICD-10-CM | POA: Diagnosis not present

## 2024-01-07 DIAGNOSIS — R5383 Other fatigue: Secondary | ICD-10-CM | POA: Diagnosis not present

## 2024-01-07 DIAGNOSIS — Z1339 Encounter for screening examination for other mental health and behavioral disorders: Secondary | ICD-10-CM | POA: Diagnosis not present

## 2024-01-07 DIAGNOSIS — Z1331 Encounter for screening for depression: Secondary | ICD-10-CM | POA: Diagnosis not present

## 2024-01-07 MED ORDER — AIRSUPRA 90-80 MCG/ACT IN AERO: 2.0000 | INHALATION_SPRAY | Freq: Four times a day (QID) | RESPIRATORY_TRACT | 1 refills | Status: AC | PRN

## 2024-01-07 MED ORDER — MONTELUKAST SODIUM 10 MG PO TABS: 10.0000 mg | ORAL_TABLET | Freq: Every day | ORAL | 2 refills | Status: AC

## 2024-01-07 NOTE — Progress Notes (Signed)
 Specialty Pharmacy Refill Coordination Note  MyChart Questionnaire Submission  Andrea Terrell is a 58 y.o. female contacted today regarding refills of specialty medication(s) Tezspire .  Doses on hand: (Patient-Rptd) 0   Injection date: (Patient-Rptd) 01/14/24  Patient requested: (Patient-Rptd) Delivery   Delivery date: 01/08/24  Verified address: [address] [place] [address]  Medication will be filled on 01/07/24.

## 2024-01-07 NOTE — Patient Instructions (Signed)
  1. Continue to treat and prevent inflammation:  A. Breztri - 2 inhalations 2 times per day B. Montelukast 10 mg tablet 1 time per day C. OTC Rhinocort / budesonide - 1-2 sprays each nostril 1 time per day D. Tezepelumab injections  2. If needed:   A. OTC Allegra 180 - 1 tablet 1-2 time per day  B. AIRSUPRA -  2 inhalations every 4-6 hour (replaces albuterol)  C. OTC Pataday - 1 drop each eye 1 time per day  3. Return to clinic in 6 months or earlier if problem  4. Influenza = Tamiflu. Covid = Paxlovid

## 2024-01-07 NOTE — Progress Notes (Signed)
 [place] - [place] - [place] - [place] [facility] [place]   Follow-up Note  Referring Provider: Keren Vicenta BRAVO, MD Primary Provider: Keren Vicenta BRAVO, MD Date of Office Visit: 01/07/2024  Subjective:   Andrea Terrell (DOB: 28-Feb-1965) is a 58 y.o. female who [facility] in re-evaluation of the following:  HPI: Andrea Terrell presents to this clinic in evaluation of asthma, allergic rhinitis.  Last seen in this clinic 10 June 2023.  Since I last seen her in clinic she has done very well without the need for any systemic steroid or antibiotic to treat any type of airway issue and rare requirement for short acting bronchodilator and very little limitation on ability to exercise while using her tezepelumab  injections, Breztri , and montelukast .  Likewise, she has had very little problems with her nose while intermittently using a nasal steroid.  Allergies as of 01/07/2024       Reactions   Ciprofloxacin Other (See Comments)   Joint pain   Codeine Other (See Comments)   NERVOUSNESS   Keflex [cephalexin] Hives   Penicillins Rash   Sulfa Antibiotics Rash        Medication List    Airsupra  90-80 MCG/ACT Aero Generic drug: Albuterol -Budesonide Inhale 2 puffs into the lungs every 6 (six) hours as needed.   atorvastatin 20 MG tablet Commonly known as: LIPITOR Take 20 mg by mouth at bedtime.   Auvi-Q  0.3 MG/0.3ML Soaj injection Generic drug: EPINEPHrine  Use as directed for life-threatening allergic reaction   Breztri  Aerosphere 160-9-4.8 MCG/ACT Aero inhaler Generic drug: budesonide-glycopyrrolate-formoterol Inhale 2 puffs into the lungs in the morning and at bedtime.   fexofenadine 180 MG tablet Commonly known as: ALLEGRA Take 180 mg by mouth daily as needed for allergies or rhinitis.   ipratropium-albuterol  0.5-2.5 (3) MG/3ML Soln Commonly known as: DUONEB Take 3 mLs by nebulization every 6 (six) hours as needed.   KRILL  OIL PO Take by mouth daily.   levalbuterol 45 MCG/ACT inhaler Commonly known as: XOPENEX HFA Inhale 2 puffs into the lungs every 6 (six) hours as needed for wheezing or shortness of breath.   levothyroxine 75 MCG tablet Commonly known as: SYNTHROID   montelukast  10 MG tablet Commonly known as: SINGULAIR  Take 1 tablet (10 mg total) by mouth daily.   NON FORMULARY CPAP nightly   Tezspire  210 MG/1. Soaj Generic drug: Tezepelumab -ekko Inject 210 mg into the skin every 28 (twenty-eight) days.   venlafaxine XR 150 MG 24 hr capsule Commonly known as: EFFEXOR-XR Take 150 mg by mouth daily.   VITAMIN C PO Take by mouth.   Vitamin D (Ergocalciferol) 1.25 MG (50000 UNIT) Caps capsule Commonly known as: DRISDOL    Past Medical History:  Diagnosis Date   Asthma    Hypothyroid    OSA (obstructive sleep apnea) 02/08/2016    Past Surgical History:  Procedure Laterality Date   ABDOMINAL HERNIA REPAIR     age 81   ADENOIDECTOMY     CERVICAL ABLATION  2006   FINGER SURGERY     age 57   REDUCTION MAMMAPLASTY Left    1995?   TONSILLECTOMY     TUBAL LIGATION  1996    Review of systems negative except as noted in HPI / PMHx or noted below:  Review of Systems  Constitutional: Negative.   HENT: Negative.    Eyes: Negative.   Respiratory: Negative.    Cardiovascular: Negative.   Gastrointestinal: Negative.   Genitourinary:  Negative.   Musculoskeletal: Negative.   Skin: Negative.   Neurological: Negative.   Endo/Heme/Allergies: Negative.   Psychiatric/Behavioral: Negative.       Objective:   Vitals:   01/07/24 1602  BP: 126/84  Pulse: 88  Resp: 16  SpO2: 98%          Physical Exam Constitutional:      Appearance: She is not diaphoretic.  HENT:     Head: Normocephalic.     Right Ear: Tympanic membrane, ear canal and external ear normal.     Left Ear: Tympanic membrane, ear canal and external ear normal.     Nose: Nose normal. No mucosal edema or  rhinorrhea.     Mouth/Throat:     Pharynx: Uvula midline. No oropharyngeal exudate.  Eyes:     Conjunctiva/sclera: Conjunctivae normal.  Neck:     Thyroid : No thyromegaly.     Trachea: Trachea normal. No tracheal tenderness or tracheal deviation.  Cardiovascular:     Rate and Rhythm: Normal rate and regular rhythm.     Heart sounds: Normal heart sounds, S1 normal and S2 normal. No murmur heard. Pulmonary:     Effort: No respiratory distress.     Breath sounds: Normal breath sounds. No stridor. No wheezing or rales.  Lymphadenopathy:     Head:     Right side of head: No tonsillar adenopathy.     Left side of head: No tonsillar adenopathy.     Cervical: No cervical adenopathy.  Skin:    Findings: No erythema or rash.     Nails: There is no clubbing.  Neurological:     Mental Status: She is alert.     Diagnostics: Spirometry was performed and demonstrated an FEV1 of 2.41 at 92 % of predicted.  Assessment and Plan:   1. Asthma, severe persistent, well-controlled (HCC)   2. Perennial allergic rhinitis    1. Continue to treat and prevent inflammation:  A. Breztri  - 2 inhalations 2 times per day B. Montelukast  10 mg tablet 1 time per day C. OTC Rhinocort / budesonide - 1-2 sprays each nostril 1 time per day D. Tezepelumab  injections  2. If needed:   A. OTC Allegra 180 - 1 tablet 1-2 time per day  B. AIRSUPRA  -  2 inhalations every 4-6 hour (replaces albuterol )  C. OTC Pataday - 1 drop each eye 1 time per day  3. Return to clinic in 6 months or earlier if problem  4. Influenza = Tamiflu. Covid = Paxlovid   Andrea Terrell appears to be doing very well with her current plan of using anti-inflammatory agents for airway including use of anti-TSLP antibody.  She will continue on the plan noted above and we will see her back in this clinic in 6 months or earlier if there is a problem.  Andrea Denis, MD Allergy / Immunology [facility]

## 2024-01-08 DIAGNOSIS — F4323 Adjustment disorder with mixed anxiety and depressed mood: Secondary | ICD-10-CM | POA: Diagnosis not present

## 2024-01-11 ENCOUNTER — Encounter: Payer: Self-pay | Admitting: Allergy and Immunology

## 2024-01-11 ENCOUNTER — Telehealth: Payer: Self-pay

## 2024-01-11 NOTE — Telephone Encounter (Signed)
 Copied from CRM #[id number]. Topic: Clinical - Request for Lab/Test Order >> Jan 11, 2024  9:27 AM Russell PARAS wrote: Reason for CRM:   Pt contacted clinic to reschedule her missed appt on 11/13. Reported having been more fatigue the past few months and saw her PCP concerning this issue. PCP recommended getting an updated sleep study or making adjustments to pressure on machine.  Requested call back with nurse to speak further about this and possibly ordering sleep study  CB# [phone]  ATC X1. LMTCB

## 2024-01-12 ENCOUNTER — Telehealth: Payer: Self-pay

## 2024-01-12 NOTE — Telephone Encounter (Signed)
 Reason for call: Left patient a message to bring SD card or machine with her to appt.

## 2024-01-12 NOTE — Telephone Encounter (Signed)
 Called and spoke with the pt. Pt had appt with pcp 11/13 and called our office to confirm her appt but states she was told the wrong day. Pt said it was scheduling issues and she was unaware of appt scheduled to see Dr. Annella for 11/13.  Pt has been scheduled to see Dr. Annella 11/19.  Pt states her PCP told her she needs a pulm follow up & maybe a new HST order.  Pt is aware of appt. Nothing further needed.

## 2024-01-12 NOTE — Telephone Encounter (Signed)
 Copied from CRM [id number]. Topic: General - Other >> Jan 11, 2024  5:27 PM Rilla B wrote: Reason for CRM: Patient returning to [place].  Please call patient at work [phone]  ATC x2. LMTCB.

## 2024-01-13 ENCOUNTER — Ambulatory Visit: Admitting: Pulmonary Disease

## 2024-01-13 ENCOUNTER — Encounter: Payer: Self-pay | Admitting: Pulmonary Disease

## 2024-01-13 VITALS — BP 129/83 | HR 74 | Temp 98.2°F | Ht 65.0 in | Wt 233.0 lb

## 2024-01-13 DIAGNOSIS — G4733 Obstructive sleep apnea (adult) (pediatric): Secondary | ICD-10-CM | POA: Diagnosis not present

## 2024-01-13 DIAGNOSIS — J4550 Severe persistent asthma, uncomplicated: Secondary | ICD-10-CM

## 2024-01-13 MED ORDER — BREZTRI AEROSPHERE 160-9-4.8 MCG/ACT IN AERO: 2.0000 | INHALATION_SPRAY | Freq: Two times a day (BID) | RESPIRATORY_TRACT | 12 refills | Status: AC

## 2024-01-13 NOTE — Progress Notes (Signed)
 @Patient  ID: Andrea Terrell, female    DOB: 07/17/1965, 58 y.o.   MRN: [id number]  Chief Complaint  Patient presents with   Asthma    Well controlled and patient would like to have another sleep study.    Referring provider: Keren Vicenta BRAVO, MD  HPI:   58 y.o. woman whom we are seeing in follow-up for dyspnea on exertion and asthma.  Asthma previously well controlled on Xolair .  Worsened symptoms over the summer/fall 2022.  Fortunately great improvement switching Breo/Spiriva  to Breztri .  TTE obtained which is reassuring.  Xolair  changed to Tezspire  by allergist fall 2024.  Doing well interim.  No exacerbations.  Breathing fine.  Rare rescue use.  Good adherence to Breztri  and Tezspire .  Been more sleepy.  Napping there today.  Worn out after activities in the morning.  Unusual for her.  As well rested.  Her CPAP machine malfunction.  She got a new one several months ago.  She bought it privately.  Her compliance report is reviewed, she is using well but her AHI is approaching 7 on average.  Several nights approaching more like 10.  On review of the last month.  HPI initial visit: Patient was in normal state of health about 3 months ago.  Relatively abrupt onset of significant dyspnea on exertion.  Very winded with steps, inclines.  Winded on flat surfaces as well.  No clear inciting event.  She denies viral infection or other infection, trauma etc.  She has been seen by PCP.  Also seen by allergy and immunology.  Received 2 courses of prednisone , course of doxycycline .  She thinks these provided mild intermittent relief but quickly returned to baseline worsening dyspnea shortly after completion.  She reports adherence to high-dose Breo, montelukast , Xolair  injections every month.  She is on these injections for many years.  In some ways seems similar to prior asthma exacerbations but in other ways different.  She has CT abdomen pelvis 2019 with no comment on abnormality in the lower part  of the lungs.  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 a theatre or a meeting) 0  As a passenger in a car for an hour without a break 2  Lying down to rest in the afternoon when circumstances permit 1  Sitting and talking to someone 0  Sitting quietly after a lunch without alcohol 0  In a car, while stopped for a few minutes in traffic 0  Total score 3    Tests:   FENO:  No results found for: NITRICOXIDE  PFT:     No data to display          WALK:      No data to display  Imaging: Personally reviewed as per EMR discussion this note   Lab Results: Personally reviewed and as per EMR CBC    Component Value Date/Time   WBC 8.9 08/25/2022 1323   RBC 4.49 08/25/2022 1323   HGB 13.3 08/25/2022 1323   HCT 40.8 08/25/2022 1323   MCV 91 08/25/2022 1323   MCH 29.6 08/25/2022 1323   MCHC 32.6 08/25/2022 1323   RDW 13.1 08/25/2022 1323   LYMPHSABS 1.8 08/25/2022 1323   EOSABS 0.2 08/25/2022 1323   BASOSABS 0.1 08/25/2022 1323    BMET No results found for: NA, K, CL, CO2, GLUCOSE, BUN, CREATININE, CALCIUM, GFRNONAA, GFRAA  BNP No results found for: BNP  ProBNP No results found for: PROBNP  Specialty Problems       Pulmonary Problems   Severe persistent asthma (HCC)   OSA (obstructive sleep  apnea)    Allergies  Allergen Reactions   Ciprofloxacin Other (See Comments)    Joint pain   Codeine Other (See Comments)    NERVOUSNESS   Keflex [Cephalexin] Hives   Penicillins Rash   Sulfa Antibiotics Rash    Immunization History  Administered Date(s) Administered   Influenza Inj Mdck Quad Pf 11/21/2016, 11/26/2018   Influenza Split 12/05/2009, 11/18/2011, 11/05/2012, 11/25/2013   Influenza,inj,Quad PF,6+ Mos 10/26/2015, 11/19/2017, 01/04/2021   Influenza-Unspecified 10/26/2015   Moderna Sars-Covid-2 Vaccination 04/07/2019, 05/02/2019   Pneumococcal Polysaccharide-23 07/16/2012    Past Medical History:  Diagnosis Date   Asthma    Hypothyroid    OSA (obstructive sleep apnea) 02/08/2016    Tobacco History: Social History   Tobacco Use  Smoking Status Former   Current packs/day: 0.00   Average packs/day: 0.5 packs/day for 5.0 years (2.5 ttl pk-yrs)   Types: Cigarettes   Start date: 76   Quit date: 1989   Years since quitting: 36.9  Smokeless Tobacco Never   Counseling given: Not Answered    Continue to not smoke  Outpatient Encounter Medications as of 01/13/2024  Medication Sig   AIRSUPRA  90-80 MCG/ACT AERO Inhale 2 puffs into the lungs every 6 (six) hours as needed.   atorvastatin (LIPITOR) 20 MG tablet Take 20 mg by mouth at bedtime.   fexofenadine (ALLEGRA) 180 MG tablet Take 180 mg by mouth daily as needed for allergies or rhinitis.   ipratropium-albuterol  (DUONEB) 0.5-2.5 (3) MG/3ML SOLN Take 3 mLs by nebulization every 6 (six) hours as needed.   levothyroxine (SYNTHROID, LEVOTHROID) 75 MCG tablet    montelukast  (SINGULAIR ) 10 MG tablet Take 1 tablet (10 mg total) by mouth daily.   NON FORMULARY CPAP nightly   Tezepelumab -ekko (TEZSPIRE ) 210 MG/1. SOAJ Inject 210 mg into the skin every 28 (twenty-eight) days.   venlafaxine XR (EFFEXOR-XR) 150 MG 24 hr capsule Take 150 mg by mouth daily.   Vitamin D, Ergocalciferol, (DRISDOL) 1.25 MG (50000 UT)  CAPS capsule    [DISCONTINUED] budeson-glycopyrrolate-formoterol (BREZTRI  AEROSPHERE) 160-9-4.8 MCG/ACT AERO inhaler Inhale 2 puffs into the lungs in the morning and at bedtime.   budesonide-glycopyrrolate-formoterol (BREZTRI  AEROSPHERE) 160-9-4.8 MCG/ACT AERO inhaler Inhale 2 puffs into the lungs in the morning and at bedtime.   [DISCONTINUED] Ascorbic Acid (VITAMIN C PO) Take by mouth. (Patient not taking: Reported on 01/13/2024)   [DISCONTINUED] AUVI-Q  0.3 MG/0.3ML SOAJ injection Use as directed for life-threatening allergic reaction (Patient not taking: Reported on 01/13/2024)   [DISCONTINUED] KRILL OIL PO Take by mouth daily. (Patient not taking: Reported on 01/13/2024)   [DISCONTINUED] levalbuterol (XOPENEX HFA) 45 MCG/ACT inhaler Inhale 2 puffs  into the lungs every 6 (six) hours as needed for wheezing or shortness of breath. (Patient not taking: Reported on 01/13/2024)   No facility-administered encounter medications on file as of 01/13/2024.     Review of Systems  Review of Systems  N/a  Physical Exam  BP 129/83   Pulse 74   Temp 98.2 F (36.8 C) (Oral)   Ht 5' 5 (1.651 m)   Wt 233 lb (105.7 kg)   SpO2 97%   BMI 38.77 kg/m   Wt Readings from Last 5 Encounters:  01/13/24 233 lb (105.7 kg)  06/04/23 260 lb (117.9 kg)  04/22/23 247 lb 6.4 oz (112.2 kg)  08/25/22 228 lb 3.2 oz (103.5 kg)  08/08/22 229 lb (103.9 kg)    BMI Readings from Last 5 Encounters:  01/13/24 38.77 kg/m  06/04/23 43.27 kg/m  04/22/23 41.17 kg/m  08/25/22 37.97 kg/m  08/08/22 38.11 kg/m     Physical Exam General: Sitting in chair, no distress Eyes: No icterus Neck: No JVP Pulmonary: Clear bilaterally, normal work of breathing, good air excursion Cardiovascular: Warm  Assessment & Plan:   Dyspnea on exertion: Initially etiology unclear, felt to be due to uncontrolled asthma that improved with changing inhaler therapy from Breo/Spiriva  to Breztri  HFA.  Chest imaging historically  clear.  TTE reassuring, only grade 1 diastolic dysfunction.  No other complaint today, adequately treated with asthma therapies.  Severe persistent asthma: Had worsening symptoms despite Xolair .  Repeat IgE within normal limits.  Symptoms improved with change to Breztri  from high-dose Breo/Spiriva .  Exacerbation 12/2021 with viral illness.  Had ongoing congestion and worsening symptoms through the spring 2024 prompting addition of thrice weekly azithromycin .  Gradually symptoms improved and back to baseline and decision to discontinue azithromycin  later in 2024.  Following exacerbation early 2025 following influenza infection.  Overall well-controlled on current regimen.  Continue Breztri  (refilled today), Tezspire .  Continue Airsupra  as needed.  OSA: Compliance report reviewed today.  Good adherence to CPAP.  AHI on average 6.7, not controlled.  Review of the last 30 days with several nights approaching AHI of 10.  Her CPAP machine was bought privately, no longer service via a DME company.  I think she needs a repeat sleep study and a reliable CPAP machine with maintenance etc. via DME company.  CPAP titration study ordered today, will order new CPAP based on results and send to DME company.  Return in about 6 months (around 07/12/2024) for f/u Dr. Annella.   Andrea JONELLE Annella, MD 01/13/2024

## 2024-01-13 NOTE — Patient Instructions (Addendum)
 Nice to see you again  No change in the medication  Breztri  refilled  I ordered a CPAP titration test to make sure we have the best level to treat your sleep apnea  Return to clinic in 6 months or sooner as needed with Dr. Annella

## 2024-02-01 ENCOUNTER — Other Ambulatory Visit: Payer: Self-pay

## 2024-02-03 ENCOUNTER — Other Ambulatory Visit: Payer: Self-pay

## 2024-02-03 ENCOUNTER — Other Ambulatory Visit: Payer: Self-pay | Admitting: Pharmacy Technician

## 2024-02-03 NOTE — Progress Notes (Signed)
 Specialty Pharmacy Refill Coordination Note  ZAYA KESSENICH is a 58 y.o. female contacted today regarding refills of specialty medication(s) Tezepelumab -ekko (Tezspire )   Patient requested (Patient-Rptd) Delivery   Delivery date: 02/09/2024   Verified address: (Patient-Rptd) [address] [place] [address]   Medication will be filled on: 02/08/2024

## 2024-02-08 ENCOUNTER — Other Ambulatory Visit: Payer: Self-pay

## 2024-02-29 ENCOUNTER — Other Ambulatory Visit (HOSPITAL_COMMUNITY): Payer: Self-pay

## 2024-03-02 ENCOUNTER — Other Ambulatory Visit: Payer: Self-pay

## 2024-03-02 ENCOUNTER — Other Ambulatory Visit: Payer: Self-pay | Admitting: Pharmacy Technician

## 2024-03-02 NOTE — Progress Notes (Signed)
 Specialty Pharmacy Refill Coordination Note  Andrea Terrell is a 59 y.o. female contacted today regarding refills of specialty medication(s) Tezepelumab -ekko (Tezspire )   Patient requested Delivery   Delivery date: 03/08/24   Verified address: (Patient-Rptd) [address] [place] [address]   Medication will be filled on: 03/07/24

## 2024-03-07 ENCOUNTER — Other Ambulatory Visit: Payer: Self-pay

## 2024-03-17 ENCOUNTER — Ambulatory Visit: Admitting: Pulmonary Disease

## 2024-03-28 ENCOUNTER — Other Ambulatory Visit: Payer: Self-pay

## 2024-03-28 ENCOUNTER — Ambulatory Visit (HOSPITAL_BASED_OUTPATIENT_CLINIC_OR_DEPARTMENT_OTHER): Admitting: Pulmonary Disease

## 2024-03-31 ENCOUNTER — Other Ambulatory Visit: Payer: Self-pay

## 2024-04-02 ENCOUNTER — Ambulatory Visit (HOSPITAL_BASED_OUTPATIENT_CLINIC_OR_DEPARTMENT_OTHER): Admitting: Pulmonary Disease

## 2024-06-06 ENCOUNTER — Ambulatory Visit (HOSPITAL_BASED_OUTPATIENT_CLINIC_OR_DEPARTMENT_OTHER): Admitting: Pulmonary Disease

## 2024-07-07 ENCOUNTER — Ambulatory Visit: Admitting: Allergy and Immunology
